# Patient Record
Sex: Female | Born: 1955 | Race: White | Hispanic: No | Smoking: Never smoker
Health system: Southern US, Community
[De-identification: ages and names within clinical notes are randomized; demographics above are authoritative.]

## PROBLEM LIST (undated history)

## (undated) DIAGNOSIS — J189 Pneumonia, unspecified organism: Secondary | ICD-10-CM

## (undated) DIAGNOSIS — T7840XA Allergy, unspecified, initial encounter: Secondary | ICD-10-CM

## (undated) DIAGNOSIS — T8859XA Other complications of anesthesia, initial encounter: Secondary | ICD-10-CM

## (undated) DIAGNOSIS — E785 Hyperlipidemia, unspecified: Secondary | ICD-10-CM

## (undated) DIAGNOSIS — Z87442 Personal history of urinary calculi: Secondary | ICD-10-CM

## (undated) DIAGNOSIS — M1990 Unspecified osteoarthritis, unspecified site: Secondary | ICD-10-CM

## (undated) DIAGNOSIS — R011 Cardiac murmur, unspecified: Secondary | ICD-10-CM

## (undated) DIAGNOSIS — I10 Essential (primary) hypertension: Secondary | ICD-10-CM

## (undated) DIAGNOSIS — K219 Gastro-esophageal reflux disease without esophagitis: Secondary | ICD-10-CM

## (undated) DIAGNOSIS — T4145XA Adverse effect of unspecified anesthetic, initial encounter: Secondary | ICD-10-CM

## (undated) HISTORY — DX: Hyperlipidemia, unspecified: E78.5

## (undated) HISTORY — PX: TOTAL KNEE ARTHROPLASTY: SHX125

## (undated) HISTORY — DX: Essential (primary) hypertension: I10

## (undated) HISTORY — DX: Allergy, unspecified, initial encounter: T78.40XA

---
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
## 1993-08-05 DIAGNOSIS — T7840XA Allergy, unspecified, initial encounter: Secondary | ICD-10-CM

## 1993-08-05 HISTORY — DX: Allergy, unspecified, initial encounter: T78.40XA

## 1997-11-02 ENCOUNTER — Ambulatory Visit (HOSPITAL_COMMUNITY): Admission: RE | Admit: 1997-11-02 | Discharge: 1997-11-02 | Payer: Self-pay | Admitting: Obstetrics and Gynecology

## 1998-02-22 ENCOUNTER — Other Ambulatory Visit: Admission: RE | Admit: 1998-02-22 | Discharge: 1998-02-22 | Payer: Self-pay | Admitting: Obstetrics and Gynecology

## 1998-12-05 ENCOUNTER — Encounter: Payer: Self-pay | Admitting: Obstetrics and Gynecology

## 1998-12-05 ENCOUNTER — Ambulatory Visit (HOSPITAL_COMMUNITY): Admission: RE | Admit: 1998-12-05 | Discharge: 1998-12-05 | Payer: Self-pay | Admitting: Obstetrics and Gynecology

## 1999-12-11 ENCOUNTER — Encounter: Payer: Self-pay | Admitting: Obstetrics and Gynecology

## 1999-12-11 ENCOUNTER — Ambulatory Visit (HOSPITAL_COMMUNITY): Admission: RE | Admit: 1999-12-11 | Discharge: 1999-12-11 | Payer: Self-pay | Admitting: Obstetrics and Gynecology

## 2000-04-28 ENCOUNTER — Other Ambulatory Visit: Admission: RE | Admit: 2000-04-28 | Discharge: 2000-04-28 | Payer: Self-pay | Admitting: *Deleted

## 2004-07-19 ENCOUNTER — Encounter (INDEPENDENT_AMBULATORY_CARE_PROVIDER_SITE_OTHER): Payer: Self-pay | Admitting: *Deleted

## 2004-07-19 ENCOUNTER — Ambulatory Visit: Admission: RE | Admit: 2004-07-19 | Discharge: 2004-07-19 | Payer: Self-pay | Admitting: Family Medicine

## 2005-12-09 ENCOUNTER — Ambulatory Visit (HOSPITAL_COMMUNITY): Admission: RE | Admit: 2005-12-09 | Discharge: 2005-12-09 | Payer: Self-pay | Admitting: Family Medicine

## 2007-01-22 ENCOUNTER — Ambulatory Visit (HOSPITAL_COMMUNITY): Admission: RE | Admit: 2007-01-22 | Discharge: 2007-01-22 | Payer: Self-pay | Admitting: *Deleted

## 2008-02-25 ENCOUNTER — Ambulatory Visit (HOSPITAL_COMMUNITY): Admission: RE | Admit: 2008-02-25 | Discharge: 2008-02-25 | Payer: Self-pay | Admitting: *Deleted

## 2008-08-05 HISTORY — PX: LITHOTRIPSY: SUR834

## 2008-08-05 HISTORY — PX: URETEROSCOPY: SHX842

## 2009-03-13 ENCOUNTER — Ambulatory Visit (HOSPITAL_COMMUNITY): Admission: RE | Admit: 2009-03-13 | Discharge: 2009-03-13 | Payer: Self-pay | Admitting: Obstetrics & Gynecology

## 2009-05-02 ENCOUNTER — Emergency Department (HOSPITAL_COMMUNITY): Admission: EM | Admit: 2009-05-02 | Discharge: 2009-05-02 | Payer: Self-pay | Admitting: Emergency Medicine

## 2009-05-11 ENCOUNTER — Ambulatory Visit (HOSPITAL_COMMUNITY): Admission: RE | Admit: 2009-05-11 | Discharge: 2009-05-11 | Payer: Self-pay | Admitting: Urology

## 2009-06-15 ENCOUNTER — Ambulatory Visit (HOSPITAL_BASED_OUTPATIENT_CLINIC_OR_DEPARTMENT_OTHER): Admission: RE | Admit: 2009-06-15 | Discharge: 2009-06-15 | Payer: Self-pay | Admitting: Urology

## 2010-05-30 ENCOUNTER — Emergency Department (HOSPITAL_COMMUNITY): Admission: EM | Admit: 2010-05-30 | Discharge: 2010-05-31 | Payer: Self-pay | Admitting: Emergency Medicine

## 2010-07-12 ENCOUNTER — Ambulatory Visit (HOSPITAL_COMMUNITY)
Admission: RE | Admit: 2010-07-12 | Discharge: 2010-07-12 | Payer: Self-pay | Source: Home / Self Care | Attending: Obstetrics & Gynecology | Admitting: Obstetrics & Gynecology

## 2010-08-05 HISTORY — PX: EYE SURGERY: SHX253

## 2010-11-07 LAB — POCT PREGNANCY, URINE: Preg Test, Ur: NEGATIVE

## 2010-11-07 LAB — POCT HEMOGLOBIN-HEMACUE: Hemoglobin: 14.6 g/dL (ref 12.0–15.0)

## 2010-11-08 LAB — PREGNANCY, URINE: Preg Test, Ur: NEGATIVE

## 2010-11-09 LAB — BASIC METABOLIC PANEL
BUN: 18 mg/dL (ref 6–23)
Calcium: 8.9 mg/dL (ref 8.4–10.5)
Creatinine, Ser: 0.77 mg/dL (ref 0.4–1.2)
GFR calc Af Amer: >60 mL/min (ref >60)
GFR calc non Af Amer: >60 mL/min (ref >60)
Glucose, Bld: 110 mg/dL — ABNORMAL HIGH (ref 70–99)
Potassium: 4.1 mEq/L (ref 3.5–5.1)
Sodium: 139 mEq/L (ref 135–145)

## 2010-11-09 LAB — URINALYSIS, ROUTINE W REFLEX MICROSCOPIC
Ketones, ur: NEGATIVE mg/dL
Specific Gravity, Urine: 1.018 (ref 1.005–1.030)
Urobilinogen, UA: 0.2 mg/dL (ref 0.0–1.0)

## 2010-11-09 LAB — URINE MICROSCOPIC-ADD ON

## 2010-11-09 LAB — DIFFERENTIAL
Basophils Absolute: 0 10*3/uL (ref 0.0–0.1)
Monocytes Absolute: 0.4 10*3/uL (ref 0.1–1.0)
Neutrophils Relative %: 89 % — ABNORMAL HIGH (ref 43–77)

## 2010-11-09 LAB — CBC
Hemoglobin: 15 g/dL (ref 12.0–15.0)
MCV: 90.2 fL (ref 78.0–100.0)
Platelets: 309 10*3/uL (ref 150–400)
RBC: 4.78 MIL/uL (ref 3.87–5.11)
WBC: 12.7 10*3/uL — ABNORMAL HIGH (ref 4.0–10.5)

## 2011-08-14 ENCOUNTER — Other Ambulatory Visit (HOSPITAL_COMMUNITY): Payer: Self-pay | Admitting: Obstetrics & Gynecology

## 2011-08-14 DIAGNOSIS — Z1231 Encounter for screening mammogram for malignant neoplasm of breast: Secondary | ICD-10-CM

## 2011-08-21 ENCOUNTER — Ambulatory Visit (HOSPITAL_COMMUNITY)
Admission: RE | Admit: 2011-08-21 | Discharge: 2011-08-21 | Disposition: A | Discharge status: Home / Self Care | Payer: BC Managed Care – PPO | Source: Ambulatory Visit | Attending: Obstetrics & Gynecology | Admitting: Obstetrics & Gynecology

## 2011-08-21 DIAGNOSIS — Z1231 Encounter for screening mammogram for malignant neoplasm of breast: Secondary | ICD-10-CM

## 2012-08-05 HISTORY — PX: DILATION AND CURETTAGE OF UTERUS: SHX78

## 2012-08-10 ENCOUNTER — Encounter (HOSPITAL_COMMUNITY): Payer: Self-pay | Admitting: Pharmacist

## 2012-08-10 ENCOUNTER — Other Ambulatory Visit: Payer: Self-pay | Admitting: Obstetrics & Gynecology

## 2012-08-11 ENCOUNTER — Encounter (HOSPITAL_COMMUNITY): Payer: Self-pay | Admitting: *Deleted

## 2012-08-19 ENCOUNTER — Ambulatory Visit (HOSPITAL_COMMUNITY): Payer: BC Managed Care – PPO | Admitting: Anesthesiology

## 2012-08-19 ENCOUNTER — Ambulatory Visit (HOSPITAL_COMMUNITY)
Admission: RE | Admit: 2012-08-19 | Discharge: 2012-08-19 | Disposition: A | Discharge status: Home / Self Care | Payer: BC Managed Care – PPO | Source: Ambulatory Visit | Attending: Obstetrics & Gynecology | Admitting: Obstetrics & Gynecology

## 2012-08-19 ENCOUNTER — Encounter (HOSPITAL_COMMUNITY): Payer: Self-pay | Admitting: *Deleted

## 2012-08-19 ENCOUNTER — Encounter (HOSPITAL_COMMUNITY): Payer: Self-pay | Admitting: Anesthesiology

## 2012-08-19 ENCOUNTER — Encounter (HOSPITAL_COMMUNITY)
Admission: RE | Disposition: A | Discharge status: Home / Self Care | Payer: Self-pay | Source: Ambulatory Visit | Attending: Obstetrics & Gynecology

## 2012-08-19 DIAGNOSIS — N840 Polyp of corpus uteri: Secondary | ICD-10-CM | POA: Insufficient documentation

## 2012-08-19 DIAGNOSIS — N950 Postmenopausal bleeding: Secondary | ICD-10-CM | POA: Insufficient documentation

## 2012-08-19 DIAGNOSIS — Q513 Bicornate uterus: Secondary | ICD-10-CM | POA: Insufficient documentation

## 2012-08-19 HISTORY — DX: Other complications of anesthesia, initial encounter: T88.59XA

## 2012-08-19 HISTORY — DX: Unspecified osteoarthritis, unspecified site: M19.90

## 2012-08-19 HISTORY — DX: Adverse effect of unspecified anesthetic, initial encounter: T41.45XA

## 2012-08-19 LAB — CBC
MCH: 30.5 pg (ref 26.0–34.0)
MCV: 87.7 fL (ref 78.0–100.0)
Platelets: 277 10*3/uL (ref 150–400)
RDW: 12.4 % (ref 11.5–15.5)
WBC: 6 10*3/uL (ref 4.0–10.5)

## 2012-08-19 SURGERY — DILATATION & CURETTAGE/HYSTEROSCOPY WITH TRUCLEAR
Anesthesia: General | Site: Vagina | Wound class: Clean Contaminated

## 2012-08-19 MED ORDER — DEXAMETHASONE SODIUM PHOSPHATE 4 MG/ML IJ SOLN
INTRAMUSCULAR | Status: DC | PRN
  Administered 2012-08-19: 10 mg via INTRAVENOUS

## 2012-08-19 MED ORDER — ONDANSETRON HCL 4 MG/2ML IJ SOLN
INTRAMUSCULAR | Status: AC
  Filled 2012-08-19: qty 2

## 2012-08-19 MED ORDER — FENTANYL CITRATE 0.05 MG/ML IJ SOLN
INTRAMUSCULAR | Status: DC | PRN
  Administered 2012-08-19 (×2): 50 ug via INTRAVENOUS

## 2012-08-19 MED ORDER — CHLOROPROCAINE HCL 1 % IJ SOLN
INTRAMUSCULAR | Status: AC
  Filled 2012-08-19: qty 30

## 2012-08-19 MED ORDER — DEXAMETHASONE SODIUM PHOSPHATE 10 MG/ML IJ SOLN
INTRAMUSCULAR | Status: AC
  Filled 2012-08-19: qty 1

## 2012-08-19 MED ORDER — PROPOFOL 10 MG/ML IV EMUL
INTRAVENOUS | Status: AC
  Filled 2012-08-19: qty 20

## 2012-08-19 MED ORDER — GLYCOPYRROLATE 0.2 MG/ML IJ SOLN
INTRAMUSCULAR | Status: AC
  Filled 2012-08-19: qty 1

## 2012-08-19 MED ORDER — DEXAMETHASONE SODIUM PHOSPHATE 4 MG/ML IJ SOLN: INTRAMUSCULAR | Status: DC | PRN

## 2012-08-19 MED ORDER — KETOROLAC TROMETHAMINE 30 MG/ML IJ SOLN
INTRAMUSCULAR | Status: AC
  Filled 2012-08-19: qty 1

## 2012-08-19 MED ORDER — CEFAZOLIN SODIUM-DEXTROSE 2-3 GM-% IV SOLR
INTRAVENOUS | Status: AC
  Filled 2012-08-19: qty 50

## 2012-08-19 MED ORDER — CHLOROPROCAINE HCL 1 % IJ SOLN
INTRAMUSCULAR | Status: DC | PRN
  Administered 2012-08-19: 20 mL

## 2012-08-19 MED ORDER — LIDOCAINE HCL (CARDIAC) 20 MG/ML IV SOLN
INTRAVENOUS | Status: DC | PRN
  Administered 2012-08-19: 100 mg via INTRAVENOUS

## 2012-08-19 MED ORDER — PROPOFOL 10 MG/ML IV EMUL
INTRAVENOUS | Status: DC | PRN
  Administered 2012-08-19: 180 mg via INTRAVENOUS
  Administered 2012-08-19: 50 mg via INTRAVENOUS

## 2012-08-19 MED ORDER — ONDANSETRON HCL 4 MG/2ML IJ SOLN
INTRAMUSCULAR | Status: DC | PRN
  Administered 2012-08-19: 4 mg via INTRAVENOUS

## 2012-08-19 MED ORDER — LIDOCAINE HCL (CARDIAC) 20 MG/ML IV SOLN
INTRAVENOUS | Status: AC
  Filled 2012-08-19: qty 5

## 2012-08-19 MED ORDER — KETOROLAC TROMETHAMINE 30 MG/ML IJ SOLN
INTRAMUSCULAR | Status: DC | PRN
  Administered 2012-08-19: 30 mg via INTRAVENOUS

## 2012-08-19 MED ORDER — HYDROCODONE-ACETAMINOPHEN 5-500 MG PO TABS: 1.0000 | ORAL_TABLET | Freq: Four times a day (QID) | ORAL | Status: DC | PRN

## 2012-08-19 MED ORDER — GLYCOPYRROLATE 0.2 MG/ML IJ SOLN
INTRAMUSCULAR | Status: DC | PRN
  Administered 2012-08-19: 0.1 mg via INTRAVENOUS

## 2012-08-19 MED ORDER — SODIUM CHLORIDE 0.9 % IR SOLN
Status: DC | PRN
  Administered 2012-08-19: 3000 mL

## 2012-08-19 MED ORDER — FENTANYL CITRATE 0.05 MG/ML IJ SOLN
INTRAMUSCULAR | Status: AC
  Filled 2012-08-19: qty 2

## 2012-08-19 MED ORDER — LACTATED RINGERS IV SOLN
INTRAVENOUS | Status: DC
  Administered 2012-08-19: 125 mL/h via INTRAVENOUS
  Administered 2012-08-19: 09:00:00 via INTRAVENOUS

## 2012-08-19 MED ORDER — CEFAZOLIN SODIUM-DEXTROSE 2-3 GM-% IV SOLR
2.0000 g | INTRAVENOUS | Status: AC
  Administered 2012-08-19: 2 g via INTRAVENOUS

## 2012-08-19 SURGICAL SUPPLY — 20 items
BLADE INCISOR TRUC PLUS 2.9 (ABLATOR) ×1 IMPLANT
CANISTERS HI-FLOW 3000CC (CANNISTER) ×4 IMPLANT
CATH ROBINSON RED A/P 16FR (CATHETERS) ×2 IMPLANT
CLOTH BEACON ORANGE TIMEOUT ST (SAFETY) ×2 IMPLANT
CONTAINER PREFILL 10% NBF 60ML (FORM) ×4 IMPLANT
DRAPE HYSTEROSCOPY (DRAPE) ×2 IMPLANT
DRESSING TELFA 8X3 (GAUZE/BANDAGES/DRESSINGS) ×2 IMPLANT
GLOVE BIO SURGEON STRL SZ 6.5 (GLOVE) ×4 IMPLANT
GLOVE BIOGEL PI IND STRL 7.0 (GLOVE) ×1 IMPLANT
GLOVE BIOGEL PI INDICATOR 7.0 (GLOVE) ×1
GOWN STRL REIN XL XLG (GOWN DISPOSABLE) ×4 IMPLANT
INCISOR TRUC PLUS BLADE 2.9 (ABLATOR) ×2
KIT HYSTEROSCOPY TRUCLEAR (ABLATOR) ×2 IMPLANT
MORCELLATOR RECIP TRUCLEAR 4.0 (ABLATOR) IMPLANT
NEEDLE SPNL 22GX3.5 QUINCKE BK (NEEDLE) ×2 IMPLANT
PACK VAGINAL MINOR WOMEN LF (CUSTOM PROCEDURE TRAY) ×2 IMPLANT
PAD OB MATERNITY 4.3X12.25 (PERSONAL CARE ITEMS) ×2 IMPLANT
SYR CONTROL 10ML LL (SYRINGE) ×2 IMPLANT
TOWEL OR 17X24 6PK STRL BLUE (TOWEL DISPOSABLE) ×4 IMPLANT
WATER STERILE IRR 1000ML POUR (IV SOLUTION) ×2 IMPLANT

## 2012-08-19 NOTE — Anesthesia Preprocedure Evaluation (Signed)
Anesthesia Evaluation  Patient identified by MRN, date of birth, ID band Patient awake    Reviewed: Allergy & Precautions, H&P , Patient's Chart, lab work & pertinent test results, reviewed documented beta blocker date and time   History of Anesthesia Complications Negative for: history of anesthetic complications  Airway Mallampati: II TM Distance: >3 FB Neck ROM: full    Dental No notable dental hx.    Pulmonary neg pulmonary ROS,  breath sounds clear to auscultation  Pulmonary exam normal       Cardiovascular Exercise Tolerance: Good negative cardio ROS  Rhythm:regular Rate:Normal     Neuro/Psych negative neurological ROS  negative psych ROS   GI/Hepatic negative GI ROS, Neg liver ROS,   Endo/Other  negative endocrine ROS  Renal/GU negative Renal ROS     Musculoskeletal   Abdominal   Peds  Hematology negative hematology ROS (+)   Anesthesia Other Findings   Reproductive/Obstetrics negative OB ROS                           Anesthesia Physical Anesthesia Plan  ASA: I  Anesthesia Plan: General LMA   Post-op Pain Management:    Induction:   Airway Management Planned:   Additional Equipment:   Intra-op Plan:   Post-operative Plan:   Informed Consent: I have reviewed the patients History and Physical, chart, labs and discussed the procedure including the risks, benefits and alternatives for the proposed anesthesia with the patient or authorized representative who has indicated his/her understanding and acceptance.   Dental Advisory Given  Plan Discussed with: CRNA, Surgeon and Anesthesiologist  Anesthesia Plan Comments:         Anesthesia Quick Evaluation  

## 2012-08-19 NOTE — H&P (Addendum)
Heather Lambert is an 57 y.o. female G0 widow  RP:  IU lesion, probable polyp  Pertinent Gynecological History: Menses: post-menopausal Bleeding: post menopausal bleeding Contraception: none, abstinence Blood transfusions: none Sexually transmitted diseases: no past history Last mammogram: normal  Last pap: normal   Menstrual History:  No LMP recorded. Patient is postmenopausal.    Past Medical History  Diagnosis Date  . Arthritis     knees    Past Surgical History  Procedure Date  . Lithotripsy   . Ureteroscopy   . Tonsillectomy   . Tonsillectomy and adenoidectomy     History reviewed. No pertinent family history.  Social History:  reports that she has never smoked. She has never used smokeless tobacco. She reports that she drinks about .6 ounces of alcohol per week. She reports that she does not use illicit drugs.  Allergies:  Allergies  Allergen Reactions  . Midazolam Other (See Comments)    Reaction during lithotripsy    Prescriptions prior to admission  Medication Sig Dispense Refill  . Multiple Vitamins-Minerals (MULTIVITAMIN WITH MINERALS) tablet Take 1 tablet by mouth daily.      . naproxen sodium (ANAPROX) 220 MG tablet Take 220 mg by mouth 2 (two) times daily as needed. For knee pain      . Omega-3 Fatty Acids (FISH OIL) 1200 MG CAPS Take 2 capsules by mouth daily.        Blood pressure 152/91, pulse 81, temperature 97.5 F (36.4 C), temperature source Oral, resp. rate 18, height 5\' 2"  (1.575 m), weight 77.111 kg (170 lb), SpO2 98.00%.  IU lesion 1.1 cm per sonohysto  No results found for this or any previous visit (from the past 24 hour(s)).  No results found.  Assessment/Plan: IU polyp for HSC, resection, D+C.  Surgery and risks reviewed.  Heather Lambert,MARIE-LYNE 08/19/2012, 7:08 AM

## 2012-08-19 NOTE — Op Note (Signed)
08/19/2012  9:21 AM  PATIENT:  Heather Lambert  57 y.o. female  PRE-OPERATIVE DIAGNOSIS:  Postmenopausal bleeding, Intrauterine lesion, Possible polyp  POST-OPERATIVE DIAGNOSIS:  postmenopausal bleeding, Intrauterine lesion, Polyp  PROCEDURE:  Procedure(s): DILATATION & CURETTAGE/HYSTEROSCOPY WITH TRUCLEAR RESECTION  SURGEON:  Surgeon(s): Genia Del, MD  ASSISTANTS: none   ANESTHESIA:  MAC and Paracervical block  PROCEDURE:  Under MAC analgesia the patient is in lithotomy position. She is prepped with Betadine on the suprapubic vulvar and vaginal areas and the bladder is catheterized. The vaginal exam reveals an anteverted uterus normal volume mobile no adnexal mass. The speculum was introduced in the vagina, the anterior lip of the cervix is grasped with a tenaculum. A paracervical block was done with Nesacaine 1% a total of 20 cc at 4 and 8:00.  The hysterometry is 7 cm. Dilation of the cervix is done with Hegar dilators up to #23 without difficulty. The hysteroscope was introduced in the intrauterine cavity. 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 The left ostium was well visualized. A small polyp was right in front of the right ostium.  A small polyp was present on the right lateral wall of the uterus as well as a low polyp on the anterior wall of the uterus.  We used the Truclear device to resect those 3 small polyps.  We then proceeded with a systematic endometrial curettage on all intrauterine surfaces with a small sharp curette. Both specimens were sent together to pathology.  The tenaculum was removed. Hemostasis was adequate. The speculum was removed. The patient was brought to recovery room in good and stable status.  ESTIMATED BLOOD LOSS: 10 cc  FLUID DEFICIT:  210 cc   Intake/Output Summary (Last 24 hours) at 08/19/12 3664 Last data filed at 08/19/12 0900  Gross per 24 hour  Intake   1000 ml  Output     10 ml  Net    990 ml     BLOOD ADMINISTERED:none   LOCAL  MEDICATIONS USED:  Nesacaine 1% 20 cc  SPECIMEN:  Source of Specimen:  Resection and endometrial curettings  DISPOSITION OF SPECIMEN:  PATHOLOGY  COUNTS:  YES  PLAN OF CARE: Transfer to PACU   Genia Del  MD   08/19/2012  At 9:24 am

## 2012-08-19 NOTE — Anesthesia Postprocedure Evaluation (Signed)
Anesthesia Post Note  Patient: Heather Lambert  Procedure(s) Performed: Procedure(s) (LRB): DILATATION & CURETTAGE/HYSTEROSCOPY WITH TRUCLEAR (N/A)  Anesthesia type: GA  Patient location: PACU  Post pain: Pain level controlled  Post assessment: Post-op Vital signs reviewed  Last Vitals:  Filed Vitals:   08/19/12 1030  BP: 129/81  Pulse: 57  Temp: 36.3 C  Resp: 24    Post vital signs: Reviewed  Level of consciousness: sedated  Complications: No apparent anesthesia complications

## 2012-08-19 NOTE — Addendum Note (Signed)
Addendum  created 08/19/12 1449 by Danyael Alipio M Antwan Bribiesca, RN   Modules edited:Charges VN    

## 2012-08-19 NOTE — Addendum Note (Signed)
Addendum  created 08/19/12 1449 by Armanda Heritage, RN   Modules edited:Charges VN

## 2012-08-19 NOTE — Discharge Summary (Signed)
  Physician Discharge Summary  Patient ID: Shakevia Sarris MRN: [id number] DOB/AGE: 56/29/1957 56 y.o.  Admit date: 08/19/2012 Discharge date: 08/19/2012  Admission Diagnoses: Postmenopausal bleeding, Intrauterine lesion, Possible polyp  Discharge Diagnoses: Postmenopausal bleeding, Intrauterine lesion, Possible polyp        Active Problems:  * No active hospital problems. *    Discharged Condition: good  Hospital Course: Outpatient  Consults: None  Treatments: surgery: Hysteroscopy with resection and D+C  Disposition: Final discharge disposition not confirmed     Medication List     As of 08/19/2012  9:32 AM    TAKE these medications         Fish Oil 1200 MG Caps   Take 2 capsules by mouth daily.      HYDROcodone-acetaminophen 5-500 MG per tablet   Commonly known as: VICODIN   Take 1 tablet by mouth every 6 (six) hours as needed for pain.      multivitamin with minerals tablet   Take 1 tablet by mouth daily.      naproxen sodium 220 MG tablet   Commonly known as: ANAPROX   Take 220 mg by mouth 2 (two) times daily as needed. For knee pain           Follow-up Information    Follow up with Oshua Mcconaha,MARIE-LYNE, MD. In 3 weeks.   Contact information:   [address] [place] [address] [phone]          Signed: Genia Del, MD 08/19/2012, 9:32 AM

## 2012-08-19 NOTE — Transfer of Care (Signed)
Immediate Anesthesia Transfer of Care Note  Patient: Heather Lambert  Procedure(s) Performed: Procedure(s) (LRB) with comments: DILATATION & CURETTAGE/HYSTEROSCOPY WITH TRUCLEAR (N/A)  Patient Location: PACU  Anesthesia Type:General  Level of Consciousness: awake, alert  and oriented  Airway & Oxygen Therapy: Patient Spontanous Breathing and Patient connected to nasal cannula oxygen  Post-op Assessment: Report given to PACU RN and Post -op Vital signs reviewed and stable  Post vital signs: Reviewed and stable  Complications: No apparent anesthesia complications

## 2012-08-19 NOTE — Anesthesia Procedure Notes (Signed)
Procedure Name: LMA Insertion Date/Time: 08/19/2012 8:38 AM Performed by: Kostas Marrow, Jannet Askew Pre-anesthesia Checklist: Patient identified, Patient being monitored, Emergency Drugs available, Timeout performed and Suction available Patient Re-evaluated:Patient Re-evaluated prior to inductionOxygen Delivery Method: Circle system utilized Preoxygenation: Pre-oxygenation with 100% oxygen Intubation Type: IV induction LMA: LMA inserted LMA Size: 4.0

## 2012-08-28 ENCOUNTER — Other Ambulatory Visit (HOSPITAL_COMMUNITY): Payer: Self-pay | Admitting: Obstetrics & Gynecology

## 2012-08-28 DIAGNOSIS — Z1231 Encounter for screening mammogram for malignant neoplasm of breast: Secondary | ICD-10-CM

## 2012-09-03 ENCOUNTER — Ambulatory Visit (HOSPITAL_COMMUNITY)
Admission: RE | Admit: 2012-09-03 | Discharge: 2012-09-03 | Disposition: A | Discharge status: Home / Self Care | Payer: BC Managed Care – PPO | Source: Ambulatory Visit | Attending: Obstetrics & Gynecology | Admitting: Obstetrics & Gynecology

## 2012-09-03 DIAGNOSIS — Z1231 Encounter for screening mammogram for malignant neoplasm of breast: Secondary | ICD-10-CM | POA: Insufficient documentation

## 2013-11-16 ENCOUNTER — Other Ambulatory Visit (HOSPITAL_COMMUNITY): Payer: Self-pay | Admitting: Obstetrics & Gynecology

## 2013-11-16 DIAGNOSIS — Z1231 Encounter for screening mammogram for malignant neoplasm of breast: Secondary | ICD-10-CM

## 2013-11-18 ENCOUNTER — Ambulatory Visit (HOSPITAL_COMMUNITY)
Admission: RE | Admit: 2013-11-18 | Discharge: 2013-11-18 | Disposition: A | Discharge status: Home / Self Care | Payer: BC Managed Care – PPO | Source: Ambulatory Visit | Attending: Obstetrics & Gynecology | Admitting: Obstetrics & Gynecology

## 2013-11-18 DIAGNOSIS — Z1231 Encounter for screening mammogram for malignant neoplasm of breast: Secondary | ICD-10-CM | POA: Insufficient documentation

## 2014-07-05 ENCOUNTER — Encounter: Payer: Self-pay | Admitting: Family Medicine

## 2014-07-05 DIAGNOSIS — I10 Essential (primary) hypertension: Secondary | ICD-10-CM | POA: Insufficient documentation

## 2014-07-05 DIAGNOSIS — T7840XA Allergy, unspecified, initial encounter: Secondary | ICD-10-CM | POA: Insufficient documentation

## 2014-07-05 DIAGNOSIS — E785 Hyperlipidemia, unspecified: Secondary | ICD-10-CM | POA: Insufficient documentation

## 2014-07-18 ENCOUNTER — Ambulatory Visit: Payer: BC Managed Care – PPO | Admitting: Physician Assistant

## 2015-01-23 ENCOUNTER — Other Ambulatory Visit (HOSPITAL_COMMUNITY): Payer: Self-pay | Admitting: Family Medicine

## 2015-01-23 DIAGNOSIS — Z1231 Encounter for screening mammogram for malignant neoplasm of breast: Secondary | ICD-10-CM

## 2015-02-01 ENCOUNTER — Ambulatory Visit (HOSPITAL_COMMUNITY)
Admission: RE | Admit: 2015-02-01 | Discharge: 2015-02-01 | Disposition: A | Discharge status: Home / Self Care | Payer: BLUE CROSS/BLUE SHIELD | Source: Ambulatory Visit | Attending: Family Medicine | Admitting: Family Medicine

## 2015-02-01 DIAGNOSIS — Z1231 Encounter for screening mammogram for malignant neoplasm of breast: Secondary | ICD-10-CM

## 2015-11-20 DIAGNOSIS — H5213 Myopia, bilateral: Secondary | ICD-10-CM | POA: Diagnosis not present

## 2015-11-29 DIAGNOSIS — E559 Vitamin D deficiency, unspecified: Secondary | ICD-10-CM | POA: Diagnosis not present

## 2015-11-29 DIAGNOSIS — Z136 Encounter for screening for cardiovascular disorders: Secondary | ICD-10-CM | POA: Diagnosis not present

## 2015-11-30 DIAGNOSIS — E785 Hyperlipidemia, unspecified: Secondary | ICD-10-CM | POA: Diagnosis not present

## 2015-11-30 DIAGNOSIS — E559 Vitamin D deficiency, unspecified: Secondary | ICD-10-CM | POA: Diagnosis not present

## 2015-11-30 DIAGNOSIS — I10 Essential (primary) hypertension: Secondary | ICD-10-CM | POA: Diagnosis not present

## 2015-11-30 DIAGNOSIS — Z6832 Body mass index (BMI) 32.0-32.9, adult: Secondary | ICD-10-CM | POA: Diagnosis not present

## 2016-02-21 ENCOUNTER — Other Ambulatory Visit: Payer: Self-pay | Admitting: Family Medicine

## 2016-02-21 DIAGNOSIS — Z1231 Encounter for screening mammogram for malignant neoplasm of breast: Secondary | ICD-10-CM

## 2016-02-29 ENCOUNTER — Ambulatory Visit
Admission: RE | Admit: 2016-02-29 | Discharge: 2016-02-29 | Disposition: A | Discharge status: Home / Self Care | Payer: BLUE CROSS/BLUE SHIELD | Source: Ambulatory Visit | Attending: Family Medicine | Admitting: Family Medicine

## 2016-02-29 DIAGNOSIS — Z1231 Encounter for screening mammogram for malignant neoplasm of breast: Secondary | ICD-10-CM

## 2016-04-10 DIAGNOSIS — Z0000 Encounter for general adult medical examination without abnormal findings: Secondary | ICD-10-CM | POA: Diagnosis not present

## 2016-04-10 DIAGNOSIS — Z136 Encounter for screening for cardiovascular disorders: Secondary | ICD-10-CM | POA: Diagnosis not present

## 2016-04-12 DIAGNOSIS — Z0100 Encounter for examination of eyes and vision without abnormal findings: Secondary | ICD-10-CM | POA: Diagnosis not present

## 2016-04-12 DIAGNOSIS — Z1211 Encounter for screening for malignant neoplasm of colon: Secondary | ICD-10-CM | POA: Diagnosis not present

## 2016-04-12 DIAGNOSIS — Z00129 Encounter for routine child health examination without abnormal findings: Secondary | ICD-10-CM | POA: Diagnosis not present

## 2016-04-12 DIAGNOSIS — Z0000 Encounter for general adult medical examination without abnormal findings: Secondary | ICD-10-CM | POA: Diagnosis not present

## 2016-04-12 DIAGNOSIS — Z23 Encounter for immunization: Secondary | ICD-10-CM | POA: Diagnosis not present

## 2016-08-07 DIAGNOSIS — H01119 Allergic dermatitis of unspecified eye, unspecified eyelid: Secondary | ICD-10-CM | POA: Diagnosis not present

## 2016-08-15 DIAGNOSIS — Z6832 Body mass index (BMI) 32.0-32.9, adult: Secondary | ICD-10-CM | POA: Diagnosis not present

## 2016-08-15 DIAGNOSIS — I10 Essential (primary) hypertension: Secondary | ICD-10-CM | POA: Diagnosis not present

## 2016-08-15 DIAGNOSIS — E785 Hyperlipidemia, unspecified: Secondary | ICD-10-CM | POA: Diagnosis not present

## 2016-11-20 DIAGNOSIS — H2513 Age-related nuclear cataract, bilateral: Secondary | ICD-10-CM | POA: Diagnosis not present

## 2016-11-22 DIAGNOSIS — M1711 Unilateral primary osteoarthritis, right knee: Secondary | ICD-10-CM | POA: Diagnosis not present

## 2016-11-22 DIAGNOSIS — M170 Bilateral primary osteoarthritis of knee: Secondary | ICD-10-CM | POA: Diagnosis not present

## 2016-11-22 DIAGNOSIS — M1712 Unilateral primary osteoarthritis, left knee: Secondary | ICD-10-CM | POA: Diagnosis not present

## 2016-12-16 DIAGNOSIS — I10 Essential (primary) hypertension: Secondary | ICD-10-CM | POA: Diagnosis not present

## 2016-12-16 DIAGNOSIS — M179 Osteoarthritis of knee, unspecified: Secondary | ICD-10-CM | POA: Diagnosis not present

## 2016-12-16 DIAGNOSIS — E785 Hyperlipidemia, unspecified: Secondary | ICD-10-CM | POA: Diagnosis not present

## 2016-12-16 DIAGNOSIS — Z6833 Body mass index (BMI) 33.0-33.9, adult: Secondary | ICD-10-CM | POA: Diagnosis not present

## 2016-12-23 DIAGNOSIS — M25512 Pain in left shoulder: Secondary | ICD-10-CM | POA: Diagnosis not present

## 2017-01-01 DIAGNOSIS — M25512 Pain in left shoulder: Secondary | ICD-10-CM | POA: Diagnosis not present

## 2017-01-07 ENCOUNTER — Encounter (HOSPITAL_COMMUNITY): Payer: Self-pay | Admitting: *Deleted

## 2017-01-23 ENCOUNTER — Other Ambulatory Visit: Payer: Self-pay | Admitting: Family Medicine

## 2017-01-23 DIAGNOSIS — Z1231 Encounter for screening mammogram for malignant neoplasm of breast: Secondary | ICD-10-CM

## 2017-01-28 ENCOUNTER — Ambulatory Visit (INDEPENDENT_AMBULATORY_CARE_PROVIDER_SITE_OTHER): Payer: BLUE CROSS/BLUE SHIELD | Admitting: Allergy and Immunology

## 2017-01-28 ENCOUNTER — Encounter: Payer: Self-pay | Admitting: Allergy and Immunology

## 2017-01-28 VITALS — BP 130/74 | HR 84 | Temp 98.1°F | Resp 16 | Ht 60.0 in | Wt 174.2 lb

## 2017-01-28 DIAGNOSIS — Z9109 Other allergy status, other than to drugs and biological substances: Secondary | ICD-10-CM

## 2017-01-28 NOTE — Progress Notes (Signed)
Dear Dr. Lequita HaltAluisio,  Thank you for referring Heather Lambert [facility] on 01/28/2017.   Below is a summation of this patient's evaluation and recommendations.  Thank you for your referral. I will keep you informed about this patient's response to treatment.   If you have any questions please do not hesitate to contact me.   Sincerely,  Jessica PriestEric J. Victorio Creeden, MD Allergy / Immunology [facility]   ______________________________________________________________________    NEW PATIENT NOTE  Referring Provider: Ollen GrossAluisio, Frank, MD Primary Provider: Lewis Moccasinewey, [name] R, MD Date of office visit: 01/28/2017    Subjective:   Chief Complaint:  Heather Lambert (DOB: 1956-02-27) is a 61 y.o. female who presents to the clinic on 01/28/2017 with a chief complaint of Allergy Testing (having knee sx replacement; metal testing) .     HPI: Heather Lambert presents to this clinic in evaluation of possible metal allergy. She is scheduled for a left total knee replacement in July and she has had a problem with metal exposure in the past. Whenever she uses any type of "silver-like" jewelry she will develop a contact rash. When she ware metal-based glasses she developed a rash at areas of contact. When she wore jewelry that had "gold plating" she developed a rash at contact. She can have exposure to stainless steel and 14 carrat Gold with no problem.  Past Medical History:  Diagnosis Date  . Allergy 1995   enviromental food, grasses, smoke  . Arthritis    knees  . Hyperlipidemia   . Hypertension     Past Surgical History:  Procedure Laterality Date  . DILATION AND CURETTAGE OF UTERUS  2014   removal of benign polyps  . EYE SURGERY  2012   laser to repair torn retina  . LITHOTRIPSY  2010  . TONSILLECTOMY  1965  . TONSILLECTOMY AND ADENOIDECTOMY  1965  . URETEROSCOPY  2010    Allergies as of 01/28/2017      Reactions   Midazolam Other (See Comments)   (Versed) Reaction during lithotripsy      Medication List      ALEVE 220 MG Caps Generic drug:  Naproxen Sodium Take by mouth.   lisinopril 30 MG tablet Commonly known as:  PRINIVIL,ZESTRIL Take 60 mg by mouth at bedtime.   multivitamin with minerals tablet Take 1 tablet by mouth daily.   rosuvastatin 5 MG tablet Commonly known as:  CRESTOR Take 5 mg by mouth at bedtime.   Vitamin D-3 1000 units Caps Take by mouth.       Review of systems negative except as noted in HPI / PMHx or noted below:  Review of Systems  Constitutional: Negative.   HENT: Negative.   Eyes: Negative.   Respiratory: Negative.   Cardiovascular: Negative.   Gastrointestinal: Negative.   Genitourinary: Negative.   Musculoskeletal: Negative.   Skin: Negative.   Neurological: Negative.   Endo/Heme/Allergies: Negative.   Psychiatric/Behavioral: Negative.     Family History  Problem Relation Age of Onset  . Arthritis Mother   . COPD Mother   . Depression Mother   . Hyperlipidemia Mother   . Hypertension Mother   . Allergic rhinitis Mother   . Cancer Father   . Hyperlipidemia Father   . Hyperlipidemia Brother   . Hypertension Brother   . Cancer Maternal Grandmother   . Alcohol abuse Maternal Grandfather   . Stroke Maternal Grandfather   .  Cancer Paternal Grandmother   . Dementia Paternal Grandmother   . Stroke Paternal Grandfather   . Asthma Neg Hx     Social History   Social History  . Marital status: Widowed    Spouse name: N/A  . Number of children: N/A  . Years of education: N/A   Occupational History  . Not on file.   Social History Main Topics  . Smoking status: Never Smoker  . Smokeless tobacco: Never Used  . Alcohol use 0.6 oz/week    1 Glasses of wine per week     Comment: socially  . Drug use: No  . Sexual activity: No   Other Topics Concern  . Not on file   Social History Narrative  . No narrative on file      Environmental and Social history  Lives in a house with a dry environment, a dog and cat located inside the household, hardwood in the bedroom, no plastic on the bed, no plastic on the pillow, no smoking with inside the household. She is a Environmental manager.  Objective:   Vitals:   01/28/17 0855  BP: 130/74  Pulse: 84  Resp: 16  Temp: 98.1 F (36.7 C)   Height: 5' (152.4 cm) Weight: 174 lb 3.2 oz (79 kg)  Physical Exam  Constitutional: She is well-developed, well-nourished, and in no distress.  HENT:  Head: Normocephalic.  Right Ear: Tympanic membrane, external ear and ear canal normal.  Left Ear: Tympanic membrane, external ear and ear canal normal.  Nose: Nose normal. No mucosal edema or rhinorrhea.  Mouth/Throat: Uvula is midline, oropharynx is clear and moist and mucous membranes are normal. No oropharyngeal exudate.  Eyes: Conjunctivae are normal.  Neck: Trachea normal. No tracheal tenderness present. No tracheal deviation present. No thyromegaly present.  Cardiovascular: Normal rate, regular rhythm, S1 normal, S2 normal and normal heart sounds.   No murmur heard. Pulmonary/Chest: Breath sounds normal. No stridor. No respiratory distress. She has no wheezes. She has no rales.  Musculoskeletal: She exhibits no edema.  Lymphadenopathy:       Head (right side): No tonsillar adenopathy present.       Head (left side): No tonsillar adenopathy present.    She has no cervical adenopathy.  Neurological: She is alert. Gait normal.  Skin: No rash noted. She is not diaphoretic. No erythema. Nails show no clubbing.  Psychiatric: Mood and affect normal.    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

## 2017-01-30 NOTE — Progress Notes (Signed)
Preop on 7/3.  Need orders in epic 

## 2017-01-31 ENCOUNTER — Ambulatory Visit: Payer: Self-pay | Admitting: Orthopedic Surgery

## 2017-01-31 ENCOUNTER — Ambulatory Visit: Payer: BLUE CROSS/BLUE SHIELD | Admitting: Allergy

## 2017-01-31 DIAGNOSIS — Z9109 Other allergy status, other than to drugs and biological substances: Secondary | ICD-10-CM

## 2017-01-31 NOTE — Progress Notes (Signed)
    Follow-up Note  RE: Heather PortelaMelinda Lambert MRN: [id number] DOB: [birth year] Date of Office Visit: 01/31/2017  Primary care provider: Lewis Moccasinewey, [name] R, MD Referring provider: Lewis Moccasinewey, [name] R, MD   Heather AlcideMelinda returns to the office today for the initial metal patch test interpretation, given suspected history of contact dermatitis.    Diagnostics:  Metal patch testing 48 hour reading: Nickel - Erythematous with papules.  Rest of the panel is negative.  Plan:  Allergic contact dermatitis The patient has been provided detailed information regarding the substances she is sensitive to, as well as products containing the substances.  Meticulous avoidance of these substances is recommended. If avoidance is not possible, the use of barrier creams or lotions is recommended.  She will notify her surgeon is is Nickel allergic.    Heather AyeShaylar Mayur Duman, MD [facility]

## 2017-02-03 NOTE — Patient Instructions (Signed)
Heather PortelaMelinda Lambert  02/03/2017   Your procedure is scheduled on:02/10/17   [facility] .             Report to admitting at   0905 AM   Call this number if you have problems the morning of surgery  [phone]   Remember: ONLY 1 PERSON MAY GO WITH YOU TO SHORT STAY TO GET  READY MORNING OF YOUR SURGERY.  Do not eat food or drink liquids :After Midnight.     Take these medicines the morning of surgery with A SIP OF WATER:eye drops as needed                                You may not have any metal on your body including hair pins and              piercings  Do not wear jewelry, make-up, lotions, powders or perfumes, deodorant             Do not wear nail polish.  Do not shave  48 hours prior to surgery.            Do not bring valuables to the hospital. [facility] IS NOT             RESPONSIBLE   FOR VALUABLES.  Contacts, dentures or bridgework may not be worn into surgery.  Leave suitcase in the car. After surgery it may be brought to your room.                  Please read over the following fact sheets you were given: _____________________________________________________________________           [facility] - Pre[facility] Before surgery, you can play an important role.  Because skin is not sterile, your skin needs to be as free of germs as possible.  You can reduce the number of germs on your skin by washing with CHG (chlorahexidine gluconate) soap before surgery.  CHG is an antiseptic cleaner which kills germs and bonds with the skin to continue killing germs even after washing. Please DO NOT use if you have an allergy to CHG or antibacterial soaps.  If your skin becomes reddened/irritated stop using the CHG and inform your nurse when you arrive at Short Stay. Do not shave (including legs and underarms) for at least 48 hours prior to the first CHG shower.  You may shave your face/neck. Please follow these instructions  carefully:  1.  Shower with CHG Soap the night before surgery and the  morning of Surgery.  2.  If you choose to wash your hair, wash your hair first as usual with your  normal  shampoo.  3.  After you shampoo, rinse your hair and body thoroughly to remove the  shampoo.                           4.  Use CHG as you would any other liquid soap.  You can apply chg directly  to the skin and wash                       Gently with a scrungie or clean washcloth.  5.  Apply the CHG Soap to your  body ONLY FROM THE NECK DOWN.   Do not use on face/ open                           Wound or open sores. Avoid contact with eyes, ears mouth and genitals (private parts).                       Wash face,  Genitals (private parts) with your normal soap.             6.  Wash thoroughly, paying special attention to the area where your surgery  will be performed.  7.  Thoroughly rinse your body with warm water from the neck down.  8.  DO NOT shower/wash with your normal soap after using and rinsing off  the CHG Soap.                9.  Pat yourself dry with a clean towel.            10.  Wear clean pajamas.            11.  Place clean sheets on your bed the night of your first shower and do not  sleep with pets. Day of Surgery : Do not apply any lotions/deodorants the morning of surgery.  Please wear clean clothes to the hospital/surgery center.  FAILURE TO FOLLOW THESE INSTRUCTIONS MAY RESULT IN THE CANCELLATION OF YOUR SURGERY PATIENT SIGNATURE_________________________________  NURSE SIGNATURE__________________________________  ________________________________________________________________________  WHAT IS A BLOOD TRANSFUSION? Blood Transfusion Information  A transfusion is the replacement of blood or some of its parts. Blood is made up of multiple cells which provide different functions.  Red blood cells carry oxygen and are used for blood loss replacement.  White blood cells fight against  infection.  Platelets control bleeding.  Plasma helps clot blood.  Other blood products are available for specialized needs, such as hemophilia or other clotting disorders. BEFORE THE TRANSFUSION  Who gives blood for transfusions?   Healthy volunteers who are fully evaluated to make sure their blood is safe. This is blood bank blood. Transfusion therapy is the safest it has ever been in the practice of medicine. Before blood is taken from a donor, a complete history is taken to make sure that person has no history of diseases nor engages in risky social behavior (examples are intravenous drug use or sexual activity with multiple partners). The donor's travel history is screened to minimize risk of transmitting infections, such as malaria. The donated blood is tested for signs of infectious diseases, such as HIV and hepatitis. The blood is then tested to be sure it is compatible with you in order to minimize the chance of a transfusion reaction. If you or a relative donates blood, this is often done in anticipation of surgery and is not appropriate for emergency situations. It takes many days to process the donated blood. RISKS AND COMPLICATIONS Although transfusion therapy is very safe and saves many lives, the main dangers of transfusion include:   Getting an infectious disease.  Developing a transfusion reaction. This is an allergic reaction to something in the blood you were given. Every precaution is taken to prevent this. The decision to have a blood transfusion has been considered carefully by your caregiver before blood is given. Blood is not given unless the benefits outweigh the risks. AFTER THE TRANSFUSION  Right after receiving a blood transfusion, you will usually feel  much better and more energetic. This is especially true if your red blood cells have gotten low (anemic). The transfusion raises the level of the red blood cells which carry oxygen, and this usually causes an energy  increase.  The nurse administering the transfusion will monitor you carefully for complications. HOME CARE INSTRUCTIONS  No special instructions are needed after a transfusion. You may find your energy is better. Speak with your caregiver about any limitations on activity for underlying diseases you may have. SEEK MEDICAL CARE IF:   Your condition is not improving after your transfusion.  You develop redness or irritation at the intravenous (IV) site. SEEK IMMEDIATE MEDICAL CARE IF:  Any of the following symptoms occur over the next 12 hours:  Shaking chills.  You have a temperature by mouth above 102 F (38.9 C), not controlled by medicine.  Chest, back, or muscle pain.  People around you feel you are not acting correctly or are confused.  Shortness of breath or difficulty breathing.  Dizziness and fainting.  You get a rash or develop hives.  You have a decrease in urine output.  Your urine turns a dark color or changes to pink, red, or brown. Any of the following symptoms occur over the next 10 days:  You have a temperature by mouth above 102 F (38.9 C), not controlled by medicine.  Shortness of breath.  Weakness after normal activity.  The white part of the eye turns yellow (jaundice).  You have a decrease in the amount of urine or are urinating less often.  Your urine turns a dark color or changes to pink, red, or brown. Document Released: 07/19/2000 Document Revised: 10/14/2011 Document Reviewed: 03/07/2008 ExitCare Patient Information 2014 [place].  _______________________________________________________________________  Incentive Spirometer  An incentive spirometer is a tool that can help keep your lungs clear and active. This tool measures how well you are filling your lungs with each breath. Taking long deep breaths may help reverse or decrease the chance of developing breathing (pulmonary) problems (especially infection) following:  A long  period of time when you are unable to move or be active. BEFORE THE PROCEDURE   If the spirometer includes an indicator to show your best effort, your nurse or respiratory therapist will set it to a desired goal.  If possible, sit up straight or lean slightly forward. Try not to slouch.  Hold the incentive spirometer in an upright position. INSTRUCTIONS FOR USE  1. Sit on the edge of your bed if possible, or sit up as far as you can in bed or on a chair. 2. Hold the incentive spirometer in an upright position. 3. Breathe out normally. 4. Place the mouthpiece in your mouth and seal your lips tightly around it. 5. Breathe in slowly and as deeply as possible, raising the piston or the ball toward the top of the column. 6. Hold your breath for 3-5 seconds or for as long as possible. Allow the piston or ball to fall to the bottom of the column. 7. Remove the mouthpiece from your mouth and breathe out normally. 8. Rest for a few seconds and repeat Steps 1 through 7 at least 10 times every 1-2 hours when you are awake. Take your time and take a few normal breaths between deep breaths. 9. The spirometer may include an indicator to show your best effort. Use the indicator as a goal to work toward during each repetition. 10. After each set of 10 deep breaths, practice coughing to be  sure your lungs are clear. If you have an incision (the cut made at the time of surgery), support your incision when coughing by placing a pillow or rolled up towels firmly against it. Once you are able to get out of bed, walk around indoors and cough well. You may stop using the incentive spirometer when instructed by your caregiver.  RISKS AND COMPLICATIONS  Take your time so you do not get dizzy or light-headed.  If you are in pain, you may need to take or ask for pain medication before doing incentive spirometry. It is harder to take a deep breath if you are having pain. AFTER USE  Rest and breathe slowly and  easily.  It can be helpful to keep track of a log of your progress. Your caregiver can provide you with a simple table to help with this. If you are using the spirometer at home, follow these instructions: [place] IF:   You are having difficultly using the spirometer.  You have trouble using the spirometer as often as instructed.  Your pain medication is not giving enough relief while using the spirometer.  You develop fever of 100.5 F (38.1 C) or higher. SEEK IMMEDIATE MEDICAL CARE IF:   You cough up bloody sputum that had not been present before.  You develop fever of 102 F (38.9 C) or greater.  You develop worsening pain at or near the incision site. MAKE SURE YOU:   Understand these instructions.  Will watch your condition.  Will get help right away if you are not doing well or get worse. Document Released: 12/02/2006 Document Revised: 10/14/2011 Document Reviewed: 02/02/2007 [facility] Patient Information 2014 [place], [place].   ________________________________________________________________________

## 2017-02-04 ENCOUNTER — Encounter (HOSPITAL_COMMUNITY): Payer: Self-pay

## 2017-02-04 ENCOUNTER — Encounter (HOSPITAL_COMMUNITY)
Admission: RE | Admit: 2017-02-04 | Discharge: 2017-02-04 | Disposition: A | Discharge status: Home / Self Care | Payer: BLUE CROSS/BLUE SHIELD | Source: Ambulatory Visit | Attending: Orthopedic Surgery | Admitting: Orthopedic Surgery

## 2017-02-04 DIAGNOSIS — Z01812 Encounter for preprocedural laboratory examination: Secondary | ICD-10-CM | POA: Insufficient documentation

## 2017-02-04 DIAGNOSIS — M1712 Unilateral primary osteoarthritis, left knee: Secondary | ICD-10-CM | POA: Insufficient documentation

## 2017-02-04 DIAGNOSIS — Z01810 Encounter for preprocedural cardiovascular examination: Secondary | ICD-10-CM | POA: Insufficient documentation

## 2017-02-04 HISTORY — DX: Pneumonia, unspecified organism: J18.9

## 2017-02-04 HISTORY — DX: Cardiac murmur, unspecified: R01.1

## 2017-02-04 HISTORY — DX: Gastro-esophageal reflux disease without esophagitis: K21.9

## 2017-02-04 HISTORY — DX: Personal history of urinary calculi: Z87.442

## 2017-02-04 LAB — SURGICAL PCR SCREEN
MRSA, PCR: NEGATIVE
Staphylococcus aureus: POSITIVE — AB

## 2017-02-04 LAB — COMPREHENSIVE METABOLIC PANEL
ALBUMIN: 4.7 g/dL (ref 3.5–5.0)
ALT: 20 U/L (ref 14–54)
ANION GAP: 9 (ref 5–15)
AST: 22 U/L (ref 15–41)
Alkaline Phosphatase: 71 U/L (ref 38–126)
BILIRUBIN TOTAL: 0.7 mg/dL (ref 0.3–1.2)
BUN: 17 mg/dL (ref 6–20)
CO2: 23 mmol/L (ref 22–32)
Calcium: 9.5 mg/dL (ref 8.9–10.3)
Chloride: 109 mmol/L (ref 101–111)
Creatinine, Ser: 0.64 mg/dL (ref 0.44–1.00)
GFR calc Af Amer: >60 mL/min (ref >60)
Glucose, Bld: 89 mg/dL (ref 65–99)
POTASSIUM: 4.1 mmol/L (ref 3.5–5.1)
Sodium: 141 mmol/L (ref 135–145)
TOTAL PROTEIN: 7.4 g/dL (ref 6.5–8.1)

## 2017-02-04 LAB — APTT: APTT: 27 s (ref 24–36)

## 2017-02-04 LAB — CBC
HEMATOCRIT: 44.2 % (ref 36.0–46.0)
Hemoglobin: 15.4 g/dL — ABNORMAL HIGH (ref 12.0–15.0)
MCH: 30.2 pg (ref 26.0–34.0)
MCHC: 34.8 g/dL (ref 30.0–36.0)
MCV: 86.7 fL (ref 78.0–100.0)
Platelets: 282 10*3/uL (ref 150–400)
RBC: 5.1 MIL/uL (ref 3.87–5.11)
RDW: 12.5 % (ref 11.5–15.5)
WBC: 5.8 10*3/uL (ref 4.0–10.5)

## 2017-02-04 LAB — ABO/RH: ABO/RH(D): O POS

## 2017-02-04 LAB — PROTIME-INR
INR: 1.02
PROTHROMBIN TIME: 13.4 s (ref 11.4–15.2)

## 2017-02-04 NOTE — Progress Notes (Signed)
Clearance 03/18/17 Dr. Duanne Guessewey on chart

## 2017-02-09 ENCOUNTER — Ambulatory Visit: Payer: Self-pay | Admitting: Orthopedic Surgery

## 2017-02-09 NOTE — H&P (Signed)
Heather Lambert DOB: 18-Mar-1956 Widowed / Language: English / Race: White Female Date of Admission:  02/10/2017 CC:  Left Knee Pain History of Present Illness The patient is a 61 year old female who comes in for a preoperative History and Physical. The patient is scheduled for a left total knee arthroplasty to be performed by Dr. Gus Rankin. Aluisio, MD at [facility] on 02/10/2017. The patient is a 61 year old female who presented with knee complaints. The patient reports left knee (worse than right) symptoms including: pain which began 4 year(s) ago without any known injury (onset after walking the dogs. She does state that she was diagnosed with osteoarthritis in 2006).The patient feels that the symptoms are worsening. The patient has the current diagnosis of knee osteoarthritis. Prior to being seen, the patient was previously evaluated by a colleague 3 year(s) ago. Previous work-up for this problem has included knee x-rays. Past treatment for this problem has included application of ice, application of heat, restricted activities and physical therapy. and include knee pain, stiffness, decreased range of motion, locking, difficulty bearing weight and difficulty ambulating. Current treatment includes application of heat, application of ice, restricted activity (she has been doing some stretching exercises, but can not really ride her bike anymore) and nonsteroidal anti-inflammatory drugs (Aleve, prn). Both knees have been very problematic for a long time now. Left knee has always been a little worse than the right. She has lost motion in her knees. It is getting harder to walk. It is affecting her back and her hips. She is at a stage now where she is having a very hard time functioning because of the knee pain and knee dysfunction. She has tried exercising which has not helped. She has had injections in the past which have not helped. She is ready to get the knees fixed at this time. They have been  treated conservatively in the past for the above stated problem and despite conservative measures, they continue to have progressive pain and severe functional limitations and dysfunction. They have failed non-operative management including home exercise, medications, and injections. It is felt that they would benefit from undergoing total joint replacement. Risks and benefits of the procedure have been discussed with the patient and they elect to proceed with surgery. There are no active contraindications to surgery such as ongoing infection or rapidly progressive neurological disease.  Problem List/Past Medical  Primary osteoarthritis of both knees (M17.0)  High blood pressure  Hypercholesterolemia  Kidney Stone  Osteoarthritis  Hemorrhoids  Urinary Tract Infection  Menopause   Allergies  Versed *HYPNOTICS/SEDATIVES/SLEEP DISORDER AGENTS*  Metal  States that she con only wear 14-karat gold jewelry, allergic to "silver, nickle, and gold-plated" Nickel Allergy  Family History Cancer  Father, Maternal Grandmother, Paternal Grandmother. Cerebrovascular Accident  Paternal Grandfather. Chronic Obstructive Lung Disease  Mother. Depression  Mother. Hypertension  Brother, Father, Mother. Osteoarthritis  Mother. Father  Deceased, Lung Cancer, Melanoma. age 60 Mother  Deceased, Hypertension. age 86 Siblings  Brother - blood pressure, cholesterol, sleep apnea  Social History  Children  0 Current drinker  11/22/2016: Currently drinks wine only occasionally per week Current work status  working full time Exercise  Moderate, 30 - 45 minutes, cycling, weight training, other type. Exercises daily; does other and gym / weights / stepper Living situation  live alone Marital status  widowed No history of drug/alcohol rehab  Not under pain contract  Number of flights of stairs before winded  4-5 Tobacco / smoke exposure  11/22/2016: no Tobacco use  Never smoker.  11/22/2016 Alcohol use  Occasional alcohol use, Drinks wine. Advance Directives  Living Will, Healthcare POA Post-Surgical Plans  Home with Friends  Medication History Aleve (220MG  Tablet, Oral as needed) Active. Lisinopril (30MG  Tablet, Oral) Active. Multivitamin (Oral) Active. Vitamin D (Oral) Specific strength unknown - Active. Rosuvastatin Calcium (5MG  Tablet, Oral) Active.   Past Surgical History Dilation and Curettage of Uterus  Tonsils/Adnoids  Lithotripsy and Cystoscopy  Retinal Laser Surgery    Review of Systems  General Not Present- Chills, Fever and Night Sweats. HEENT Not Present- Hearing problems and Vision problems. Respiratory Not Present- Shortness of breath. Cardiovascular Present- Hypertension. Gastrointestinal Not Present- Constipation, Diarrhea, Heartburn, Nausea and Vomiting. Female Genitourinary Not Present- Blood in Urine and Urgency. Musculoskeletal Present- Decreased Range of Motion, Joint Pain, Joint Stiffness, Morning Stiffness and Muscle Cramps.  Vitals Weight: 175 lb Height: 61.5in Weight was reported by patient. Height was reported by patient. Body Surface Area: 1.8 m Body Mass Index: 32.53 kg/m  Pulse: 76 (Regular)  BP: 148/88 (Sitting, Left Arm, Standard)   Physical Exam General Mental Status -Alert, cooperative and good historian. General Appearance-pleasant, Not in acute distress. Orientation-Oriented X3. Build & Nutrition-Well nourished and Well developed.  Head and Neck Head-normocephalic, atraumatic . Neck Global Assessment - supple, no bruit auscultated on the right, no bruit auscultated on the left.  Eye Vision-Wears corrective lenses. Pupil - Bilateral-Regular and Round. Motion - Bilateral-EOMI.  Chest and Lung Exam Auscultation Breath sounds - clear at anterior chest wall and clear at posterior chest wall. Adventitious sounds - No Adventitious  sounds.  Cardiovascular Auscultation Rhythm - Regular rate and rhythm. Heart Sounds - S1 WNL and S2 WNL. Murmurs & Other Heart Sounds - Auscultation of the heart reveals - No Murmurs.  Abdomen Palpation/Percussion Tenderness - Abdomen is non-tender to palpation. Rigidity (guarding) - Abdomen is soft. Auscultation Auscultation of the abdomen reveals - Bowel sounds normal.  Female Genitourinary Note: Not done, not pertinent to present illness   Musculoskeletal Note: On exam, she is alert and oriented, in no apparent distress. Both hips have normal range of motion without discomfort. Her left knee shows varus deformity, range is 15 to 80 with no instability. She is tender in medial and lateral. Right knee, no effusion. Range is 10 to 110. Marked crepitus on range of motion. Tender medial greater than lateral with no instability.  RADIOGRAPHS AP and lateral of both knees show that she has severe end-stage arthritis of both knees, medial and patellofemoral with lateral involvement also. Left knee is slightly worse than the right, but both are severely affected.  Assessment & Plan Primary osteoarthritis of both knees (M17.0)  Note:Surgical Plans: Left Total Knee Replacement  Disposition: Home  PCP: Dr. Duanne Guessewey - patient was seen preop for surgical clearance.  IV TXA  Anesthesia Issues: Questionable reaction to Versed  Patient was instructed on what medications to stop prior to surgery.  Please note that the patient was referred to an Allergist preop for metal testing prior to surgery. 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 She will need the Asculap prosthesis.  Signed electronically by Lauraine RinneAlexzandrew L Buford Bremer, III PA-C

## 2017-02-10 ENCOUNTER — Encounter (HOSPITAL_COMMUNITY)
Admission: RE | Disposition: A | Discharge status: Home / Self Care | Payer: Self-pay | Source: Ambulatory Visit | Attending: Orthopedic Surgery

## 2017-02-10 ENCOUNTER — Inpatient Hospital Stay (HOSPITAL_COMMUNITY): Payer: BLUE CROSS/BLUE SHIELD | Admitting: Anesthesiology

## 2017-02-10 ENCOUNTER — Encounter (HOSPITAL_COMMUNITY): Payer: Self-pay

## 2017-02-10 ENCOUNTER — Inpatient Hospital Stay (HOSPITAL_COMMUNITY)
Admission: RE | Admit: 2017-02-10 | Discharge: 2017-02-12 | DRG: 470 | Disposition: A | Discharge status: Home / Self Care | Payer: BLUE CROSS/BLUE SHIELD | Source: Ambulatory Visit | Attending: Orthopedic Surgery | Admitting: Orthopedic Surgery

## 2017-02-10 DIAGNOSIS — Z8249 Family history of ischemic heart disease and other diseases of the circulatory system: Secondary | ICD-10-CM

## 2017-02-10 DIAGNOSIS — Z801 Family history of malignant neoplasm of trachea, bronchus and lung: Secondary | ICD-10-CM

## 2017-02-10 DIAGNOSIS — M1712 Unilateral primary osteoarthritis, left knee: Secondary | ICD-10-CM | POA: Diagnosis not present

## 2017-02-10 DIAGNOSIS — E7800 Pure hypercholesterolemia, unspecified: Secondary | ICD-10-CM | POA: Diagnosis not present

## 2017-02-10 DIAGNOSIS — Z87442 Personal history of urinary calculi: Secondary | ICD-10-CM

## 2017-02-10 DIAGNOSIS — M170 Bilateral primary osteoarthritis of knee: Principal | ICD-10-CM | POA: Diagnosis present

## 2017-02-10 DIAGNOSIS — M179 Osteoarthritis of knee, unspecified: Secondary | ICD-10-CM

## 2017-02-10 DIAGNOSIS — I10 Essential (primary) hypertension: Secondary | ICD-10-CM | POA: Diagnosis present

## 2017-02-10 DIAGNOSIS — M1710 Unilateral primary osteoarthritis, unspecified knee: Secondary | ICD-10-CM | POA: Diagnosis present

## 2017-02-10 DIAGNOSIS — Z808 Family history of malignant neoplasm of other organs or systems: Secondary | ICD-10-CM

## 2017-02-10 DIAGNOSIS — Z825 Family history of asthma and other chronic lower respiratory diseases: Secondary | ICD-10-CM

## 2017-02-10 DIAGNOSIS — G8918 Other acute postprocedural pain: Secondary | ICD-10-CM | POA: Diagnosis not present

## 2017-02-10 DIAGNOSIS — E785 Hyperlipidemia, unspecified: Secondary | ICD-10-CM | POA: Diagnosis not present

## 2017-02-10 HISTORY — PX: TOTAL KNEE ARTHROPLASTY: SHX125

## 2017-02-10 LAB — TYPE AND SCREEN
ABO/RH(D): O POS
Antibody Screen: NEGATIVE

## 2017-02-10 SURGERY — ARTHROPLASTY, KNEE, TOTAL
Anesthesia: Spinal | Site: Knee | Laterality: Left

## 2017-02-10 MED ORDER — DEXAMETHASONE SODIUM PHOSPHATE 10 MG/ML IJ SOLN
INTRAMUSCULAR | Status: AC
  Filled 2017-02-10: qty 1

## 2017-02-10 MED ORDER — TRAMADOL HCL 50 MG PO TABS: 50.0000 mg | ORAL_TABLET | Freq: Four times a day (QID) | ORAL | Status: DC | PRN

## 2017-02-10 MED ORDER — GABAPENTIN 300 MG PO CAPS
300.0000 mg | ORAL_CAPSULE | Freq: Once | ORAL | Status: AC
  Administered 2017-02-10: 300 mg via ORAL

## 2017-02-10 MED ORDER — BUPIVACAINE IN DEXTROSE 0.75-8.25 % IT SOLN
INTRATHECAL | Status: DC | PRN
  Administered 2017-02-10: 1.8 mL via INTRATHECAL

## 2017-02-10 MED ORDER — BUPIVACAINE LIPOSOME 1.3 % IJ SUSP
20.0000 mL | Freq: Once | INTRAMUSCULAR | Status: DC
  Filled 2017-02-10: qty 20

## 2017-02-10 MED ORDER — CEFAZOLIN SODIUM-DEXTROSE 2-4 GM/100ML-% IV SOLN
INTRAVENOUS | Status: AC
  Filled 2017-02-10: qty 100

## 2017-02-10 MED ORDER — PHENYLEPHRINE HCL 10 MG/ML IJ SOLN
INTRAVENOUS | Status: DC | PRN
  Administered 2017-02-10: 40 ug/min via INTRAVENOUS

## 2017-02-10 MED ORDER — CEFAZOLIN SODIUM-DEXTROSE 2-4 GM/100ML-% IV SOLN
2.0000 g | Freq: Four times a day (QID) | INTRAVENOUS | Status: AC
  Administered 2017-02-10 – 2017-02-11 (×2): 2 g via INTRAVENOUS
  Filled 2017-02-10 (×2): qty 100

## 2017-02-10 MED ORDER — SODIUM CHLORIDE 0.9 % IJ SOLN
INTRAMUSCULAR | Status: DC | PRN
  Administered 2017-02-10: 30 mL

## 2017-02-10 MED ORDER — MORPHINE SULFATE (PF) 4 MG/ML IV SOLN: 1.0000 mg | INTRAVENOUS | Status: DC | PRN

## 2017-02-10 MED ORDER — LACTATED RINGERS IV SOLN
INTRAVENOUS | Status: DC
Start: 2017-02-10 — End: 2017-02-10
  Administered 2017-02-10: 13:00:00 via INTRAVENOUS
  Administered 2017-02-10: 1000 mL via INTRAVENOUS

## 2017-02-10 MED ORDER — ONDANSETRON HCL 4 MG/2ML IJ SOLN
INTRAMUSCULAR | Status: DC | PRN
  Administered 2017-02-10: 4 mg via INTRAVENOUS

## 2017-02-10 MED ORDER — METOCLOPRAMIDE HCL 5 MG PO TABS: 5.0000 mg | ORAL_TABLET | Freq: Three times a day (TID) | ORAL | Status: DC | PRN

## 2017-02-10 MED ORDER — BUPIVACAINE-EPINEPHRINE (PF) 0.5% -1:200000 IJ SOLN
INTRAMUSCULAR | Status: DC | PRN
  Administered 2017-02-10: 30 mL via PERINEURAL

## 2017-02-10 MED ORDER — SODIUM CHLORIDE 0.9 % IV SOLN
INTRAVENOUS | Status: DC
  Administered 2017-02-10 – 2017-02-11 (×2): via INTRAVENOUS

## 2017-02-10 MED ORDER — ACETAMINOPHEN 10 MG/ML IV SOLN
1000.0000 mg | Freq: Once | INTRAVENOUS | Status: AC
  Administered 2017-02-10: 1000 mg via INTRAVENOUS

## 2017-02-10 MED ORDER — DOCUSATE SODIUM 100 MG PO CAPS
100.0000 mg | ORAL_CAPSULE | Freq: Two times a day (BID) | ORAL | Status: DC
  Administered 2017-02-10 – 2017-02-12 (×4): 100 mg via ORAL
  Filled 2017-02-10 (×4): qty 1

## 2017-02-10 MED ORDER — ACETAMINOPHEN 10 MG/ML IV SOLN
INTRAVENOUS | Status: AC
  Filled 2017-02-10: qty 100

## 2017-02-10 MED ORDER — LACTATED RINGERS IV SOLN: INTRAVENOUS | Status: DC

## 2017-02-10 MED ORDER — PHENYLEPHRINE HCL 10 MG/ML IJ SOLN
INTRAMUSCULAR | Status: AC
  Filled 2017-02-10: qty 1

## 2017-02-10 MED ORDER — METOCLOPRAMIDE HCL 5 MG/ML IJ SOLN: 5.0000 mg | Freq: Three times a day (TID) | INTRAMUSCULAR | Status: DC | PRN

## 2017-02-10 MED ORDER — OXYCODONE HCL 5 MG PO TABS
5.0000 mg | ORAL_TABLET | ORAL | Status: DC | PRN
  Administered 2017-02-10 (×2): 5 mg via ORAL
  Administered 2017-02-11 (×4): 10 mg via ORAL
  Administered 2017-02-11 (×2): 5 mg via ORAL
  Administered 2017-02-12 (×3): 10 mg via ORAL
  Filled 2017-02-10: qty 2
  Filled 2017-02-10: qty 1
  Filled 2017-02-10: qty 2
  Filled 2017-02-10: qty 1
  Filled 2017-02-10 (×6): qty 2
  Filled 2017-02-10: qty 1
  Filled 2017-02-10: qty 2

## 2017-02-10 MED ORDER — CHLORHEXIDINE GLUCONATE 4 % EX LIQD: 60.0000 mL | Freq: Once | CUTANEOUS | Status: DC

## 2017-02-10 MED ORDER — DEXAMETHASONE SODIUM PHOSPHATE 10 MG/ML IJ SOLN
10.0000 mg | Freq: Once | INTRAMUSCULAR | Status: AC
  Administered 2017-02-11: 10 mg via INTRAVENOUS
  Filled 2017-02-10: qty 1

## 2017-02-10 MED ORDER — ONDANSETRON HCL 4 MG/2ML IJ SOLN
INTRAMUSCULAR | Status: AC
  Filled 2017-02-10: qty 2

## 2017-02-10 MED ORDER — GABAPENTIN 300 MG PO CAPS
ORAL_CAPSULE | ORAL | Status: AC
  Administered 2017-02-10: 300 mg via ORAL
  Filled 2017-02-10: qty 1

## 2017-02-10 MED ORDER — ACETAMINOPHEN 325 MG PO TABS
650.0000 mg | ORAL_TABLET | Freq: Four times a day (QID) | ORAL | Status: DC | PRN
  Administered 2017-02-12: 650 mg via ORAL
  Filled 2017-02-10: qty 2

## 2017-02-10 MED ORDER — BUPIVACAINE LIPOSOME 1.3 % IJ SUSP
INTRAMUSCULAR | Status: DC | PRN
  Administered 2017-02-10: 20 mL

## 2017-02-10 MED ORDER — FENTANYL CITRATE (PF) 100 MCG/2ML IJ SOLN: 25.0000 ug | INTRAMUSCULAR | Status: DC | PRN

## 2017-02-10 MED ORDER — PROPOFOL 10 MG/ML IV BOLUS
INTRAVENOUS | Status: AC
  Filled 2017-02-10: qty 20

## 2017-02-10 MED ORDER — BISACODYL 10 MG RE SUPP: 10.0000 mg | Freq: Every day | RECTAL | Status: DC | PRN

## 2017-02-10 MED ORDER — DEXAMETHASONE SODIUM PHOSPHATE 10 MG/ML IJ SOLN
10.0000 mg | Freq: Once | INTRAMUSCULAR | Status: AC
  Administered 2017-02-10: 10 mg via INTRAVENOUS

## 2017-02-10 MED ORDER — LIDOCAINE 2% (20 MG/ML) 5 ML SYRINGE
INTRAMUSCULAR | Status: AC
  Filled 2017-02-10: qty 5

## 2017-02-10 MED ORDER — PHENOL 1.4 % MT LIQD
1.0000 | OROMUCOSAL | Status: DC | PRN
  Filled 2017-02-10: qty 177

## 2017-02-10 MED ORDER — STERILE WATER FOR IRRIGATION IR SOLN
Status: DC | PRN
  Administered 2017-02-10: 2000 mL

## 2017-02-10 MED ORDER — 0.9 % SODIUM CHLORIDE (POUR BTL) OPTIME
TOPICAL | Status: DC | PRN
  Administered 2017-02-10: 1000 mL

## 2017-02-10 MED ORDER — TRANEXAMIC ACID 1000 MG/10ML IV SOLN
1000.0000 mg | INTRAVENOUS | Status: AC
  Administered 2017-02-10: 1000 mg via INTRAVENOUS
  Filled 2017-02-10: qty 1100

## 2017-02-10 MED ORDER — MEPERIDINE HCL 50 MG/ML IJ SOLN: 6.2500 mg | INTRAMUSCULAR | Status: DC | PRN

## 2017-02-10 MED ORDER — FLEET ENEMA 7-19 GM/118ML RE ENEM: 1.0000 | ENEMA | Freq: Once | RECTAL | Status: DC | PRN

## 2017-02-10 MED ORDER — DIPHENHYDRAMINE HCL 12.5 MG/5ML PO ELIX: 12.5000 mg | ORAL_SOLUTION | ORAL | Status: DC | PRN

## 2017-02-10 MED ORDER — POLYETHYLENE GLYCOL 3350 17 G PO PACK: 17.0000 g | PACK | Freq: Every day | ORAL | Status: DC | PRN

## 2017-02-10 MED ORDER — SODIUM CHLORIDE 0.9 % IJ SOLN
INTRAMUSCULAR | Status: AC
  Filled 2017-02-10: qty 50

## 2017-02-10 MED ORDER — CEFAZOLIN SODIUM-DEXTROSE 2-4 GM/100ML-% IV SOLN
2.0000 g | INTRAVENOUS | Status: AC
  Administered 2017-02-10: 2 g via INTRAVENOUS

## 2017-02-10 MED ORDER — ROSUVASTATIN CALCIUM 5 MG PO TABS
5.0000 mg | ORAL_TABLET | Freq: Every day | ORAL | Status: DC
  Administered 2017-02-10 – 2017-02-11 (×2): 5 mg via ORAL
  Filled 2017-02-10 (×2): qty 1

## 2017-02-10 MED ORDER — ACETAMINOPHEN 500 MG PO TABS
1000.0000 mg | ORAL_TABLET | Freq: Four times a day (QID) | ORAL | Status: AC
  Administered 2017-02-10 – 2017-02-11 (×4): 1000 mg via ORAL
  Filled 2017-02-10 (×4): qty 2

## 2017-02-10 MED ORDER — METOCLOPRAMIDE HCL 5 MG/ML IJ SOLN: 10.0000 mg | Freq: Once | INTRAMUSCULAR | Status: DC | PRN

## 2017-02-10 MED ORDER — METHOCARBAMOL 500 MG PO TABS
500.0000 mg | ORAL_TABLET | Freq: Four times a day (QID) | ORAL | Status: DC | PRN
  Administered 2017-02-11 – 2017-02-12 (×3): 500 mg via ORAL
  Filled 2017-02-10 (×3): qty 1

## 2017-02-10 MED ORDER — RIVAROXABAN 10 MG PO TABS
10.0000 mg | ORAL_TABLET | Freq: Every day | ORAL | Status: DC
  Administered 2017-02-11 – 2017-02-12 (×2): 10 mg via ORAL
  Filled 2017-02-10 (×2): qty 1

## 2017-02-10 MED ORDER — MENTHOL 3 MG MT LOZG: 1.0000 | LOZENGE | OROMUCOSAL | Status: DC | PRN

## 2017-02-10 MED ORDER — TRANEXAMIC ACID 1000 MG/10ML IV SOLN
1000.0000 mg | Freq: Once | INTRAVENOUS | Status: AC
  Administered 2017-02-10: 1000 mg via INTRAVENOUS
  Filled 2017-02-10: qty 1100

## 2017-02-10 MED ORDER — FENTANYL CITRATE (PF) 100 MCG/2ML IJ SOLN
INTRAMUSCULAR | Status: AC
  Administered 2017-02-10: 100 ug via INTRAVENOUS
  Filled 2017-02-10: qty 2

## 2017-02-10 MED ORDER — ONDANSETRON HCL 4 MG/2ML IJ SOLN: 4.0000 mg | Freq: Four times a day (QID) | INTRAMUSCULAR | Status: DC | PRN

## 2017-02-10 MED ORDER — ONDANSETRON HCL 4 MG PO TABS: 4.0000 mg | ORAL_TABLET | Freq: Four times a day (QID) | ORAL | Status: DC | PRN

## 2017-02-10 MED ORDER — PROPOFOL 500 MG/50ML IV EMUL
INTRAVENOUS | Status: DC | PRN
  Administered 2017-02-10: 75 ug/kg/min via INTRAVENOUS

## 2017-02-10 MED ORDER — PROPOFOL 10 MG/ML IV BOLUS
INTRAVENOUS | Status: DC | PRN
  Administered 2017-02-10 (×2): 20 mg via INTRAVENOUS
  Administered 2017-02-10 (×2): 30 mg via INTRAVENOUS

## 2017-02-10 MED ORDER — METHOCARBAMOL 1000 MG/10ML IJ SOLN
500.0000 mg | Freq: Four times a day (QID) | INTRAMUSCULAR | Status: DC | PRN
  Administered 2017-02-10: 500 mg via INTRAVENOUS
  Filled 2017-02-10: qty 550

## 2017-02-10 MED ORDER — PROPOFOL 10 MG/ML IV BOLUS
INTRAVENOUS | Status: AC
  Filled 2017-02-10: qty 40

## 2017-02-10 MED ORDER — SODIUM CHLORIDE 0.9 % IR SOLN
Status: DC | PRN
  Administered 2017-02-10: 1000 mL

## 2017-02-10 MED ORDER — ACETAMINOPHEN 650 MG RE SUPP: 650.0000 mg | Freq: Four times a day (QID) | RECTAL | Status: DC | PRN

## 2017-02-10 MED ORDER — FENTANYL CITRATE (PF) 100 MCG/2ML IJ SOLN
100.0000 ug | Freq: Once | INTRAMUSCULAR | Status: AC
  Administered 2017-02-10: 100 ug via INTRAVENOUS

## 2017-02-10 SURGICAL SUPPLY — 49 items
BAG ZIPLOCK 12X15 (MISCELLANEOUS) ×2 IMPLANT
BANDAGE ACE 6X5 VEL STRL LF (GAUZE/BANDAGES/DRESSINGS) ×2 IMPLANT
BLADE SAG 18X100X1.27 (BLADE) ×2 IMPLANT
BLADE SAW SGTL 11.0X1.19X90.0M (BLADE) ×2 IMPLANT
BOWL SMART MIX CTS (DISPOSABLE) ×2 IMPLANT
CAP KNEE TOTAL 3 ×2 IMPLANT
CEMENT HV SMART SET (Cement) ×4 IMPLANT
COVER SURGICAL LIGHT HANDLE (MISCELLANEOUS) ×2 IMPLANT
CUFF TOURN SGL QUICK 34 (TOURNIQUET CUFF) ×1
CUFF TRNQT CYL 34X4X40X1 (TOURNIQUET CUFF) ×1 IMPLANT
DECANTER SPIKE VIAL GLASS SM (MISCELLANEOUS) ×2 IMPLANT
DRAPE U-SHAPE 47X51 STRL (DRAPES) ×2 IMPLANT
DRSG ADAPTIC 3X8 NADH LF (GAUZE/BANDAGES/DRESSINGS) ×2 IMPLANT
DRSG PAD ABDOMINAL 8X10 ST (GAUZE/BANDAGES/DRESSINGS) ×2 IMPLANT
DURAPREP 26ML APPLICATOR (WOUND CARE) ×2 IMPLANT
ELECT REM PT RETURN 15FT ADLT (MISCELLANEOUS) ×2 IMPLANT
EVACUATOR 1/8 PVC DRAIN (DRAIN) ×2 IMPLANT
GAUZE SPONGE 4X4 12PLY STRL (GAUZE/BANDAGES/DRESSINGS) ×2 IMPLANT
GLOVE BIO SURGEON STRL SZ8 (GLOVE) ×2 IMPLANT
GLOVE BIOGEL PI IND STRL 6.5 (GLOVE) ×2 IMPLANT
GLOVE BIOGEL PI IND STRL 7.5 (GLOVE) ×3 IMPLANT
GLOVE BIOGEL PI IND STRL 8 (GLOVE) ×1 IMPLANT
GLOVE BIOGEL PI INDICATOR 6.5 (GLOVE) ×2
GLOVE BIOGEL PI INDICATOR 7.5 (GLOVE) ×3
GLOVE BIOGEL PI INDICATOR 8 (GLOVE) ×1
GLOVE ECLIPSE 6.5 STRL STRAW (GLOVE) ×2 IMPLANT
GLOVE SURG SS PI 6.5 STRL IVOR (GLOVE) ×2 IMPLANT
GLOVE SURG SS PI 7.5 STRL IVOR (GLOVE) ×2 IMPLANT
GOWN STRL REUS W/ TWL XL LVL3 (GOWN DISPOSABLE) ×2 IMPLANT
GOWN STRL REUS W/TWL LRG LVL3 (GOWN DISPOSABLE) ×4 IMPLANT
GOWN STRL REUS W/TWL XL LVL3 (GOWN DISPOSABLE) ×2
HANDPIECE INTERPULSE COAX TIP (DISPOSABLE) ×1
IMMOBILIZER KNEE 20 (SOFTGOODS) ×2
IMMOBILIZER KNEE 20 THIGH 36 (SOFTGOODS) ×1 IMPLANT
MANIFOLD NEPTUNE II (INSTRUMENTS) ×2 IMPLANT
PACK TOTAL KNEE CUSTOM (KITS) ×2 IMPLANT
PADDING CAST COTTON 6X4 STRL (CAST SUPPLIES) ×6 IMPLANT
POSITIONER SURGICAL ARM (MISCELLANEOUS) ×2 IMPLANT
SET HNDPC FAN SPRY TIP SCT (DISPOSABLE) ×1 IMPLANT
STRIP CLOSURE SKIN 1/2X4 (GAUZE/BANDAGES/DRESSINGS) ×4 IMPLANT
SUT MNCRL AB 4-0 PS2 18 (SUTURE) ×2 IMPLANT
SUT STRATAFIX 0 PDS 27 VIOLET (SUTURE) ×2
SUT VIC AB 2-0 CT1 27 (SUTURE) ×3
SUT VIC AB 2-0 CT1 TAPERPNT 27 (SUTURE) ×3 IMPLANT
SUTURE STRATFX 0 PDS 27 VIOLET (SUTURE) ×1 IMPLANT
SYR 30ML LL (SYRINGE) ×4 IMPLANT
TRAY FOLEY CATH SILVER 14FR (SET/KITS/TRAYS/PACK) ×2 IMPLANT
WRAP KNEE MAXI GEL POST OP (GAUZE/BANDAGES/DRESSINGS) ×2 IMPLANT
YANKAUER SUCT BULB TIP 10FT TU (MISCELLANEOUS) ×2 IMPLANT

## 2017-02-10 NOTE — Anesthesia Procedure Notes (Signed)
Anesthesia Regional Block: Adductor canal block   Pre-Anesthetic Checklist: ,, timeout performed, Correct Patient, Correct Site, Correct Laterality, Correct Procedure, Correct Position, site marked, Risks and benefits discussed,  Surgical consent,  Pre-op evaluation,  At surgeon's request and post-op pain management  Laterality: Left and Lower  Prep: Maximum Sterile Barrier Precautions used, chloraprep       Needles:  Injection technique: Single-shot  Needle Type: Echogenic Stimulator Needle     Needle Length: 10cm      Additional Needles:   Procedures: ultrasound guided,,,,,,,,  Narrative:  Start time: 02/10/2017 10:48 AM End time: 02/10/2017 10:58 AM Injection made incrementally with aspirations every 5 mL.  Performed by: Personally  Anesthesiologist: Phillips GroutARIGNAN, Sister Carbone  Additional Notes: Risks, benefits and alternative to block explained extensively.  Patient tolerated procedure well, without complications.

## 2017-02-10 NOTE — Progress Notes (Signed)
AssistedDr. Carignan with left, ultrasound guided, adductor canal block. Side rails up, monitors on throughout procedure. See vital signs in flow sheet. Tolerated Procedure well.  

## 2017-02-10 NOTE — Transfer of Care (Signed)
Immediate Anesthesia Transfer of Care Note  Patient: Heather Lambert  Procedure(s) Performed: Procedure(s) with comments: LEFT TOTAL KNEE ARTHROPLASTY (Left) - Adductor Block  Patient Location: PACU  Anesthesia Type:Spinal  Level of Consciousness:  sedated, patient cooperative and responds to stimulation  Airway & Oxygen Therapy:Patient Spontanous Breathing and Patient connected to face mask oxgen  Post-op Assessment:  Report given to PACU RN and Post -op Vital signs reviewed and stable  Post vital signs:  Reviewed and stable  Last Vitals:  Vitals:   02/10/17 1105 02/10/17 1120  BP:  136/78  Pulse: 88 74  Resp: 16 10  Temp:      Complications: No apparent anesthesia complications

## 2017-02-10 NOTE — Op Note (Signed)
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 Athalene Kolle, MD  Assistant- Dimitri Ped, PA-C   Anesthesia-  Adductor canal block and spinal  EBL-* No blood loss amount entered *   Drains Hemovac  Tourniquet time- 42 minutes @ 300 mm Hg  Complications- None  Condition-PACU - hemodynamically stable.   Brief Clinical Note  Heather Lambert is a 61 y.o. year old female with end stage OA of her left knee with progressively worsening pain and dysfunction. She has constant pain, with activity and at rest and significant functional deficits with difficulties even with ADLs. She has had extensive non-op management including analgesics, injections of cortisone and viscosupplements, and home exercise program, but remains in significant pain with significant dysfunction. Radiographs show bone on bone arthritis all 3 compartments. She presents now for left Total Knee Arthroplasty.    Procedure in detail---   The patient is brought into the operating room and positioned supine on the operating table. After successful administration of  Adductor canal block and spinal,   a tourniquet is placed high on the  Left thigh(s) and the lower extremity is prepped and draped in the usual sterile fashion. Time out is performed by the operating team and then the  Left lower extremity is wrapped in Esmarch, knee flexed and the tourniquet inflated to 300 mmHg.       A midline incision is made with a ten blade through the subcutaneous tissue to the level of the extensor mechanism. A fresh blade is used to make a medial parapatellar arthrotomy. Soft tissue over the proximal medial tibia is subperiosteally elevated to the joint line with a knife and into the semimembranosus bursa with a Cobb  elevator. Soft tissue over the proximal lateral tibia is elevated with attention being paid to avoiding the patellar tendon on the tibial tubercle. The patella is everted, knee flexed 90 degrees and the ACL and PCL are removed. Findings are bone on bone all 3 compartments with massive global osteophytes.        The drill is used to create a starting hole in the distal femur and the canal is thoroughly irrigated with sterile saline to remove the fatty contents. The 5 degree Left  valgus alignment guide is placed into the femoral canal and the distal femoral cutting block is pinned to remove 10 mm off the distal femur. Resection is made with an oscillating saw.      The tibia is subluxed forward and the menisci are removed. The extramedullary alignment guide is placed referencing proximally at the medial aspect of the tibial tubercle and distally along the second metatarsal axis and tibial crest. The block is pinned to remove 2mm off the more deficient medial  side. Resection is made with an oscillating saw. Size 1is the most appropriate size for the tibia and the proximal tibia is prepared with the modular drill and keel punch for that size.      The femoral sizing guide is placed and size 3 is most appropriate. Rotation is marked off the epicondylar axis and confirmed by creating a rectangular flexion gap at 90 degrees. The size 3 cutting block is pinned in this rotation and the anterior, posterior and chamfer cuts are made with the oscillating saw. The intercondylar block is then placed and that cut is made.  Trial size 1 tibial component, trial size 3 narrow posterior stabilized femur and a 14  mm posterior stabilized insert trial is placed. Full extension is achieved with excellent varus/valgus and anterior/posterior balance throughout full range of motion. The patella is everted and thickness measured to be 21  mm. Free hand resection is taken to 12 mm, a 2 template is placed, lug holes are drilled,  trial patella is placed, and it tracks normally. Osteophytes are removed off the posterior femur with the trial in place. All trials are removed and the cut bone surfaces prepared with pulsatile lavage. Cement is mixed and once ready for implantation, the size 1 tibial implant, size  3 narrow posterior stabilized femoral component, and the size 2 patella are cemented in place and the patella is held with the clamp. The trial insert is placed and the knee held in full extension. The Exparel (20 ml mixed with 60 ml saline) is injected into the extensor mechanism, posterior capsule, medial and lateral gutters and subcutaneous tissues.  All extruded cement is removed and once the cement is hard the permanent 14 mm posterior stabilized rotating platform insert is placed into the tibial tray.      The wound is copiously irrigated with saline solution and the extensor mechanism closed over a hemovac drain with #1 V-loc suture. The tourniquet is released for a total tourniquet time of 42  minutes. Flexion against gravity is 140 degrees and the patella tracks normally. Subcutaneous tissue is closed with 2.0 vicryl and subcuticular with running 4.0 Monocryl. The incision is cleaned and dried and steri-strips and a bulky sterile dressing are applied. The limb is placed into a knee immobilizer and the patient is awakened and transported to recovery in stable condition.      Please note that a surgical assistant was a medical necessity for this procedure in order to perform it in a safe and expeditious manner. Surgical assistant was necessary to retract the ligaments and vital neurovascular structures to prevent injury to them and also necessary for proper positioning of the limb to allow for anatomic placement of the prosthesis.   Gus RankinFrank V. Jesiah Yerby, MD    02/10/2017, 12:46 PM

## 2017-02-10 NOTE — Discharge Instructions (Addendum)
° °Dr. Frank Aluisio °Total Joint Specialist °[facility] °[address] °[place], [place] [address] °[phone] ° °TOTAL KNEE REPLACEMENT POSTOPERATIVE DIRECTIONS ° °Knee Rehabilitation, Guidelines Following Surgery  °Results after knee surgery are often greatly improved when you follow the exercise, range of motion and muscle strengthening exercises prescribed by your doctor. Safety measures are also important to protect the knee from further injury. Any time any of these exercises cause you to have increased pain or swelling in your knee joint, decrease the amount until you are comfortable again and slowly increase them. If you have problems or questions, call your caregiver or physical therapist for advice.  ° °HOME CARE INSTRUCTIONS  °Remove items at home which could result in a fall. This includes throw rugs or furniture in walking pathways.  °· ICE to the affected knee every three hours for 30 minutes at a time and then as needed for pain and swelling.  Continue to use ice on the knee for pain and swelling from surgery. You may notice swelling that will progress down to the foot and ankle.  This is normal after surgery.  Elevate the leg when you are not up walking on it.   °· Continue to use the breathing machine which will help keep your temperature down.  It is common for your temperature to cycle up and down following surgery, especially at night when you are not up moving around and exerting yourself.  The breathing machine keeps your lungs expanded and your temperature down. °· Do not place pillow under knee, focus on keeping the knee straight while resting ° °DIET °You may resume your previous home diet once your are discharged from the hospital. ° °DRESSING / WOUND CARE / SHOWERING °You may shower 3 days after surgery, but keep the wounds dry during showering.  You may use an occlusive plastic wrap (Press'n Seal for example), NO SOAKING/SUBMERGING IN THE BATHTUB.  If the  bandage gets wet, change with a clean dry gauze.  If the incision gets wet, pat the wound dry with a clean towel. °You may start showering once you are discharged home but do not submerge the incision under water. Just pat the incision dry and apply a dry gauze dressing on daily. °Change the surgical dressing daily and reapply a dry dressing each time. ° °ACTIVITY °Walk with your walker as instructed. °Use walker as long as suggested by your caregivers. °Avoid periods of inactivity such as sitting longer than an hour when not asleep. This helps prevent blood clots.  °You may resume a sexual relationship in one month or when given the OK by your doctor.  °You may return to work once you are cleared by your doctor.  °Do not drive a car for 6 weeks or until released by you surgeon.  °Do not drive while taking narcotics. ° °WEIGHT BEARING °Weight bearing as tolerated with assist device (walker, cane, etc) as directed, use it as long as suggested by your surgeon or therapist, typically at least 4-6 weeks. ° °POSTOPERATIVE CONSTIPATION PROTOCOL °Constipation - defined medically as fewer than three stools per week and severe constipation as less than one stool per week. ° °One of the most common issues patients have following surgery is constipation.  Even if you have a regular bowel pattern at home, your normal regimen is likely to be disrupted due to multiple reasons following surgery.  Combination of anesthesia, postoperative narcotics, change in appetite and fluid intake all can affect your bowels.    In order to avoid complications following surgery, here are some recommendations in order to help you during your recovery period. ° °Colace (docusate) - Pick up an over-the-counter form of Colace or another stool softener and take twice a day as long as you are requiring postoperative pain medications.  Take with a full glass of water daily.  If you experience loose stools or diarrhea, hold the colace until you stool forms  back up.  If your symptoms do not get better within 1 week or if they get worse, check with your doctor. ° °Dulcolax (bisacodyl) - Pick up over-the-counter and take as directed by the product packaging as needed to assist with the movement of your bowels.  Take with a full glass of water.  Use this product as needed if not relieved by Colace only.  ° °MiraLax (polyethylene glycol) - Pick up over-the-counter to have on hand.  MiraLax is a solution that will increase the amount of water in your bowels to assist with bowel movements.  Take as directed and can mix with a glass of water, juice, soda, coffee, or tea.  Take if you go more than two days without a movement. °Do not use MiraLax more than once per day. Call your doctor if you are still constipated or irregular after using this medication for 7 days in a row. ° °If you continue to have problems with postoperative constipation, please contact the office for further assistance and recommendations.  If you experience "the worst abdominal pain ever" or develop nausea or vomiting, please contact the office immediatly for further recommendations for treatment. ° °ITCHING ° If you experience itching with your medications, try taking only a single pain pill, or even half a pain pill at a time.  You can also use Benadryl over the counter for itching or also to help with sleep.  ° °TED HOSE STOCKINGS °Wear the elastic stockings on both legs for three weeks following surgery during the day but you may remove then at night for sleeping. ° °MEDICATIONS °See your medication summary on the “After Visit Summary” that the nursing staff will review with you prior to discharge.  You may have some home medications which will be placed on hold until you complete the course of blood thinner medication.  It is important for you to complete the blood thinner medication as prescribed by your surgeon.  Continue your approved medications as instructed at time of  discharge. ° °PRECAUTIONS °If you experience chest pain or shortness of breath - call 911 immediately for transfer to the hospital emergency department.  °If you develop a fever greater that 101 F, purulent drainage from wound, increased redness or drainage from wound, foul odor from the wound/dressing, or calf pain - CONTACT YOUR SURGEON.   °                                                °FOLLOW-UP APPOINTMENTS °Make sure you keep all of your appointments after your operation with your surgeon and caregivers. You should call the office at the above phone number and make an appointment for approximately two weeks after the date of your surgery or on the date instructed by your surgeon outlined in the "After Visit Summary". ° ° °RANGE OF MOTION AND STRENGTHENING EXERCISES  °Rehabilitation of the knee is important following a knee injury or   an operation. After just a few days of immobilization, the muscles of the thigh which control the knee become weakened and shrink (atrophy). Knee exercises are designed to build up the tone and strength of the thigh muscles and to improve knee motion. Often times heat used for twenty to thirty minutes before working out will loosen up your tissues and help with improving the range of motion but do not use heat for the first two weeks following surgery. These exercises can be done on a training (exercise) mat, on the floor, on a table or on a bed. Use what ever works the best and is most comfortable for you Knee exercises include:  °Leg Lifts - While your knee is still immobilized in a splint or cast, you can do straight leg raises. Lift the leg to 60 degrees, hold for 3 sec, and slowly lower the leg. Repeat 10-20 times 2-3 times daily. Perform this exercise against resistance later as your knee gets better.  °Quad and Hamstring Sets - Tighten up the muscle on the front of the thigh (Quad) and hold for 5-10 sec. Repeat this 10-20 times hourly. Hamstring sets are done by pushing the  foot backward against an object and holding for 5-10 sec. Repeat as with quad sets.  °· Leg Slides: Lying on your back, slowly slide your foot toward your buttocks, bending your knee up off the floor (only go as far as is comfortable). Then slowly slide your foot back down until your leg is flat on the floor again. °· Angel Wings: Lying on your back spread your legs to the side as far apart as you can without causing discomfort.  °A rehabilitation program following serious knee injuries can speed recovery and prevent re-injury in the future due to weakened muscles. Contact your doctor or a physical therapist for more information on knee rehabilitation.  ° °IF YOU ARE TRANSFERRED TO A SKILLED REHAB FACILITY °If the patient is transferred to a skilled rehab facility following release from the hospital, a list of the current medications will be sent to the facility for the patient to continue.  When discharged from the skilled rehab facility, please have the facility set up the patient's Home Health Physical Therapy prior to being released. Also, the skilled facility will be responsible for providing the patient with their medications at time of release from the facility to include their pain medication, the muscle relaxants, and their blood thinner medication. If the patient is still at the rehab facility at time of the two week follow up appointment, the skilled rehab facility will also need to assist the patient in arranging follow up appointment in our office and any transportation needs. ° °MAKE SURE YOU:  °Understand these instructions.  °Get help right away if you are not doing well or get worse.  ° ° °Pick up stool softner and laxative for home use following surgery while on pain medications. °Do not submerge incision under water. °Please use good hand washing techniques while changing dressing each day. °May shower starting three days after surgery. °Please use a clean towel to pat the incision dry following  showers. °Continue to use ice for pain and swelling after surgery. °Do not use any lotions or creams on the incision until instructed by your surgeon. ° °Take Xarelto for two and a half more weeks following discharge from the hospital, then discontinue Xarelto. °Once the patient has completed the blood thinner regimen, then take a Baby 81 mg Aspirin daily for three   more weeks. ° °Information on my medicine - XARELTO® (Rivaroxaban) ° °This medication education was reviewed with me or my healthcare representative as part of my discharge preparation.  The pharmacist that spoke with me during my hospital stay was:  Glogovac,nikola, RPH ° °Why was Xarelto® prescribed for you? °Xarelto® was prescribed for you to reduce the risk of blood clots forming after orthopedic surgery. The medical term for these abnormal blood clots is venous thromboembolism (VTE). ° °What do you need to know about xarelto® ? °Take your Xarelto® ONCE DAILY at the same time every day. °You may take it either with or without food. ° °If you have difficulty swallowing the tablet whole, you may crush it and mix in applesauce just prior to taking your dose. ° °Take Xarelto® exactly as prescribed by your doctor and DO NOT stop taking Xarelto® without talking to the doctor who prescribed the medication.  Stopping without other VTE prevention medication to take the place of Xarelto® may increase your risk of developing a clot. ° °After discharge, you should have regular check-up appointments with your healthcare provider that is prescribing your Xarelto®.   ° °What do you do if you miss a dose? °If you miss a dose, take it as soon as you remember on the same day then continue your regularly scheduled once daily regimen the next day. Do not take two doses of Xarelto® on the same day.  ° °Important Safety Information °A possible side effect of Xarelto® is bleeding. You should call your healthcare provider right away if you experience any of the  following: °? Bleeding from an injury or your nose that does not stop. °? Unusual colored urine (red or dark brown) or unusual colored stools (red or black). °? Unusual bruising for unknown reasons. °? A serious fall or if you hit your head (even if there is no bleeding). ° °Some medicines may interact with Xarelto® and might increase your risk of bleeding while on Xarelto®. To help avoid this, consult your healthcare provider or pharmacist prior to using any new prescription or non-prescription medications, including herbals, vitamins, non-steroidal anti-inflammatory drugs (NSAIDs) and supplements. ° °This website has more information on Xarelto®: www.xarelto.com. ° ° ° °

## 2017-02-10 NOTE — Anesthesia Procedure Notes (Addendum)
Spinal  Patient location during procedure: OR Start time: 02/10/2017 11:39 AM End time: 02/10/2017 11:46 AM Reason for block: at surgeon's request Staffing Anesthesiologist: Montez Hageman Performed: anesthesiologist  Preanesthetic Checklist Completed: patient identified, site marked, surgical consent, pre-op evaluation, timeout performed, IV checked, risks and benefits discussed and monitors and equipment checked Spinal Block Patient position: sitting Prep: DuraPrep Patient monitoring: heart rate, continuous pulse ox and blood pressure Approach: right paramedian Location: L2-3 Injection technique: single-shot Needle Needle type: Pencan  Needle gauge: 24 G Needle length: 9 cm Assessment Sensory level: T6 Additional Notes Expiration date of kit checked and confirmed. Patient tolerated procedure well, without complications. X 2 attempt.  CRNA X 1 L2-L3 without success, Anesthesiologist X 1 same location with noted clear CSF return. Loss of motor and sensory on exam post injection.

## 2017-02-10 NOTE — H&P (View-Only) (Signed)
Heather Lambert DOB: 18-Mar-1956 Widowed / Language: English / Race: White Female Date of Admission:  02/10/2017 CC:  Left Knee Pain History of Present Illness The patient is a 61 year old female who comes in for a preoperative History and Physical. The patient is scheduled for a left total knee arthroplasty to be performed by Dr. Gus Rankin. Aluisio, MD at [facility] on 02/10/2017. The patient is a 61 year old female who presented with knee complaints. The patient reports left knee (worse than right) symptoms including: pain which began 4 year(s) ago without any known injury (onset after walking the dogs. She does state that she was diagnosed with osteoarthritis in 2006).The patient feels that the symptoms are worsening. The patient has the current diagnosis of knee osteoarthritis. Prior to being seen, the patient was previously evaluated by a colleague 3 year(s) ago. Previous work-up for this problem has included knee x-rays. Past treatment for this problem has included application of ice, application of heat, restricted activities and physical therapy. and include knee pain, stiffness, decreased range of motion, locking, difficulty bearing weight and difficulty ambulating. Current treatment includes application of heat, application of ice, restricted activity (she has been doing some stretching exercises, but can not really ride her bike anymore) and nonsteroidal anti-inflammatory drugs (Aleve, prn). Both knees have been very problematic for a long time now. Left knee has always been a little worse than the right. She has lost motion in her knees. It is getting harder to walk. It is affecting her back and her hips. She is at a stage now where she is having a very hard time functioning because of the knee pain and knee dysfunction. She has tried exercising which has not helped. She has had injections in the past which have not helped. She is ready to get the knees fixed at this time. They have been  treated conservatively in the past for the above stated problem and despite conservative measures, they continue to have progressive pain and severe functional limitations and dysfunction. They have failed non-operative management including home exercise, medications, and injections. It is felt that they would benefit from undergoing total joint replacement. Risks and benefits of the procedure have been discussed with the patient and they elect to proceed with surgery. There are no active contraindications to surgery such as ongoing infection or rapidly progressive neurological disease.  Problem List/Past Medical  Primary osteoarthritis of both knees (M17.0)  High blood pressure  Hypercholesterolemia  Kidney Stone  Osteoarthritis  Hemorrhoids  Urinary Tract Infection  Menopause   Allergies  Versed *HYPNOTICS/SEDATIVES/SLEEP DISORDER AGENTS*  Metal  States that she con only wear 14-karat gold jewelry, allergic to "silver, nickle, and gold-plated" Nickel Allergy  Family History Cancer  Father, Maternal Grandmother, Paternal Grandmother. Cerebrovascular Accident  Paternal Grandfather. Chronic Obstructive Lung Disease  Mother. Depression  Mother. Hypertension  Brother, Father, Mother. Osteoarthritis  Mother. Father  Deceased, Lung Cancer, Melanoma. age 60 Mother  Deceased, Hypertension. age 86 Siblings  Brother - blood pressure, cholesterol, sleep apnea  Social History  Children  0 Current drinker  11/22/2016: Currently drinks wine only occasionally per week Current work status  working full time Exercise  Moderate, 30 - 45 minutes, cycling, weight training, other type. Exercises daily; does other and gym / weights / stepper Living situation  live alone Marital status  widowed No history of drug/alcohol rehab  Not under pain contract  Number of flights of stairs before winded  4-5 Tobacco / smoke exposure  11/22/2016: no Tobacco use  Never smoker.  11/22/2016 Alcohol use  Occasional alcohol use, Drinks wine. Advance Directives  Living Will, Healthcare POA Post-Surgical Plans  Home with Friends  Medication History Aleve (220MG  Tablet, Oral as needed) Active. Lisinopril (30MG  Tablet, Oral) Active. Multivitamin (Oral) Active. Vitamin D (Oral) Specific strength unknown - Active. Rosuvastatin Calcium (5MG  Tablet, Oral) Active.   Past Surgical History Dilation and Curettage of Uterus  Tonsils/Adnoids  Lithotripsy and Cystoscopy  Retinal Laser Surgery    Review of Systems  General Not Present- Chills, Fever and Night Sweats. HEENT Not Present- Hearing problems and Vision problems. Respiratory Not Present- Shortness of breath. Cardiovascular Present- Hypertension. Gastrointestinal Not Present- Constipation, Diarrhea, Heartburn, Nausea and Vomiting. Female Genitourinary Not Present- Blood in Urine and Urgency. Musculoskeletal Present- Decreased Range of Motion, Joint Pain, Joint Stiffness, Morning Stiffness and Muscle Cramps.  Vitals Weight: 175 lb Height: 61.5in Weight was reported by patient. Height was reported by patient. Body Surface Area: 1.8 m Body Mass Index: 32.53 kg/m  Pulse: 76 (Regular)  BP: 148/88 (Sitting, Left Arm, Standard)   Physical Exam General Mental Status -Alert, cooperative and good historian. General Appearance-pleasant, Not in acute distress. Orientation-Oriented X3. Build & Nutrition-Well nourished and Well developed.  Head and Neck Head-normocephalic, atraumatic . Neck Global Assessment - supple, no bruit auscultated on the right, no bruit auscultated on the left.  Eye Vision-Wears corrective lenses. Pupil - Bilateral-Regular and Round. Motion - Bilateral-EOMI.  Chest and Lung Exam Auscultation Breath sounds - clear at anterior chest wall and clear at posterior chest wall. Adventitious sounds - No Adventitious  sounds.  Cardiovascular Auscultation Rhythm - Regular rate and rhythm. Heart Sounds - S1 WNL and S2 WNL. Murmurs & Other Heart Sounds - Auscultation of the heart reveals - No Murmurs.  Abdomen Palpation/Percussion Tenderness - Abdomen is non-tender to palpation. Rigidity (guarding) - Abdomen is soft. Auscultation Auscultation of the abdomen reveals - Bowel sounds normal.  Female Genitourinary Note: Not done, not pertinent to present illness   Musculoskeletal Note: On exam, she is alert and oriented, in no apparent distress. Both hips have normal range of motion without discomfort. Her left knee shows varus deformity, range is 15 to 80 with no instability. She is tender in medial and lateral. Right knee, no effusion. Range is 10 to 110. Marked crepitus on range of motion. Tender medial greater than lateral with no instability.  RADIOGRAPHS AP and lateral of both knees show that she has severe end-stage arthritis of both knees, medial and patellofemoral with lateral involvement also. Left knee is slightly worse than the right, but both are severely affected.  Assessment & Plan Primary osteoarthritis of both knees (M17.0)  Note:Surgical Plans: Left Total Knee Replacement  Disposition: Home  PCP: Dr. Duanne Guessewey - patient was seen preop for surgical clearance.  IV TXA  Anesthesia Issues: Questionable reaction to Versed  Patient was instructed on what medications to stop prior to surgery.  Please note that the patient was referred to an Allergist preop for metal testing prior to surgery. 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 She will need the Asculap prosthesis.  Signed electronically by Lauraine RinneAlexzandrew L Perkins, III PA-C

## 2017-02-10 NOTE — Anesthesia Postprocedure Evaluation (Signed)
Anesthesia Post Note  Patient: Deniece PortelaMelinda Aderman  Procedure(s) Performed: Procedure(s) (LRB): LEFT TOTAL KNEE ARTHROPLASTY (Left)     Patient location during evaluation: PACU Anesthesia Type: Spinal and Regional Level of consciousness: awake and alert Pain management: pain level controlled Vital Signs Assessment: post-procedure vital signs reviewed and stable Respiratory status: spontaneous breathing and respiratory function stable Cardiovascular status: blood pressure returned to baseline and stable Postop Assessment: no headache, no backache and spinal receding Anesthetic complications: no    Last Vitals:  Vitals:   02/10/17 1400 02/10/17 1418  BP:  117/65  Pulse:  (!) 58  Resp:  14  Temp: 36.4 C 36.4 C    Last Pain:  Vitals:   02/10/17 1418  TempSrc: Oral  PainSc: 0-No pain                 Phillips Groutarignan, Ezelle Surprenant

## 2017-02-10 NOTE — Interval H&P Note (Signed)
History and Physical Interval Note:  02/10/2017 9:31 AM  Heather Lambert  has presented today for surgery, with the diagnosis of Osteoarthritis Left Knee  The various methods of treatment have been discussed with the patient and family. After consideration of risks, benefits and other options for treatment, the patient has consented to  Procedure(s): LEFT TOTAL KNEE ARTHROPLASTY (Left) as a surgical intervention .  The patient's history has been reviewed, patient examined, no change in status, stable for surgery.  I have reviewed the patient's chart and labs.  Questions were answered to the patient's satisfaction.     Loanne DrillingALUISIO,Cale Decarolis V

## 2017-02-10 NOTE — Anesthesia Preprocedure Evaluation (Signed)
Anesthesia Evaluation  Patient identified by MRN, date of birth, ID band Patient awake    Reviewed: Allergy & Precautions, NPO status , Patient's Chart, lab work & pertinent test results  Airway Mallampati: II  TM Distance: >3 FB Neck ROM: Full    Dental no notable dental hx.    Pulmonary neg pulmonary ROS,    Pulmonary exam normal breath sounds clear to auscultation       Cardiovascular hypertension, Pt. on medications Normal cardiovascular exam Rhythm:Regular Rate:Normal     Neuro/Psych negative neurological ROS  negative psych ROS   GI/Hepatic negative GI ROS, Neg liver ROS,   Endo/Other  negative endocrine ROS  Renal/GU negative Renal ROS  negative genitourinary   Musculoskeletal negative musculoskeletal ROS (+)   Abdominal   Peds negative pediatric ROS (+)  Hematology negative hematology ROS (+)   Anesthesia Other Findings   Reproductive/Obstetrics negative OB ROS                             Anesthesia Physical Anesthesia Plan  ASA: II  Anesthesia Plan: Spinal   Post-op Pain Management: GA combined w/ Regional for post-op pain   Induction:   PONV Risk Score and Plan: 2 and Ondansetron and Dexamethasone  Airway Management Planned: Simple Face Mask  Additional Equipment:   Intra-op Plan:   Post-operative Plan:   Informed Consent: I have reviewed the patients History and Physical, chart, labs and discussed the procedure including the risks, benefits and alternatives for the proposed anesthesia with the patient or authorized representative who has indicated his/her understanding and acceptance.   Dental advisory given  Plan Discussed with:   Anesthesia Plan Comments:         Anesthesia Quick Evaluation

## 2017-02-11 LAB — BASIC METABOLIC PANEL
Anion gap: 6 (ref 5–15)
BUN: 12 mg/dL (ref 6–20)
CALCIUM: 8.4 mg/dL — AB (ref 8.9–10.3)
CO2: 25 mmol/L (ref 22–32)
CREATININE: 0.7 mg/dL (ref 0.44–1.00)
Chloride: 109 mmol/L (ref 101–111)
GFR calc Af Amer: >60 mL/min (ref >60)
GFR calc non Af Amer: >60 mL/min (ref >60)
GLUCOSE: 147 mg/dL — AB (ref 65–99)
Potassium: 4.5 mmol/L (ref 3.5–5.1)
Sodium: 140 mmol/L (ref 135–145)

## 2017-02-11 LAB — CBC
HEMATOCRIT: 36.5 % (ref 36.0–46.0)
Hemoglobin: 12.2 g/dL (ref 12.0–15.0)
MCH: 29.6 pg (ref 26.0–34.0)
MCHC: 33.4 g/dL (ref 30.0–36.0)
MCV: 88.6 fL (ref 78.0–100.0)
PLATELETS: 245 10*3/uL (ref 150–400)
RBC: 4.12 MIL/uL (ref 3.87–5.11)
RDW: 12.5 % (ref 11.5–15.5)
WBC: 11.4 10*3/uL — ABNORMAL HIGH (ref 4.0–10.5)

## 2017-02-11 MED ORDER — SODIUM CHLORIDE 0.9 % IV BOLUS (SEPSIS)
250.0000 mL | Freq: Once | INTRAVENOUS | Status: AC
  Administered 2017-02-11: 250 mL via INTRAVENOUS

## 2017-02-11 MED ORDER — OXYCODONE HCL 5 MG PO TABS: 5.0000 mg | ORAL_TABLET | ORAL | 0 refills | Status: DC | PRN

## 2017-02-11 MED ORDER — METHOCARBAMOL 500 MG PO TABS: 500.0000 mg | ORAL_TABLET | Freq: Four times a day (QID) | ORAL | 0 refills | Status: DC | PRN

## 2017-02-11 MED ORDER — RIVAROXABAN 10 MG PO TABS: 10.0000 mg | ORAL_TABLET | Freq: Every day | ORAL | 0 refills | Status: DC

## 2017-02-11 MED ORDER — TRAMADOL HCL 50 MG PO TABS: 50.0000 mg | ORAL_TABLET | Freq: Four times a day (QID) | ORAL | 0 refills | Status: DC | PRN

## 2017-02-11 NOTE — Evaluation (Signed)
Occupational Therapy Evaluation Patient Details Name: Heather PortelaMelinda Lambert MRN: [id number] DOB: 1955-12-10 Today's Date: 02/11/2017    History of Present Illness Pt is a 61 y/o F s/p L TKA.    Clinical Impression   This 61 y/o F presents with the above. At baseline Pt is independent with ADLs, iADLs (Pt drives and is self-employed), and functional mobility. Pt currently requires MinGuard assist for functional mobility and MinA for LB ADLs. Pt lives alone, has arranged for friends to stay after discharge to offer 24hr assist initially for ADL completion PRN. Education provided throughout on AE, DME and compensatory techniques for completing ADLs and functional mobility transfers. Questions answered throughout. Pt reports feeling comfortable completing ADLs after return home and with available assist from friends PRN. No further acute OT needs identified at this time. Will sign off.     Follow Up Recommendations  No OT follow up;Supervision/Assistance - 24 hour (initially )    Equipment Recommendations  3 in 1 bedside commode           Precautions / Restrictions Precautions Precautions: Fall Precaution Comments: reviewed knee precautions  Restrictions Weight Bearing Restrictions: No Other Position/Activity Restrictions: WBAT LLE       Mobility Bed Mobility Overal bed mobility: Needs Assistance Bed Mobility: Supine to Sit     Supine to sit: Min guard;HOB elevated     General bed mobility comments: min guard for safety   Transfers Overall transfer level: Needs assistance Equipment used: Rolling walker (2 wheeled) Transfers: Sit to/from Stand Sit to Stand: Min guard         General transfer comment: close guard for safety; Pt demonstrates proper UE placement during transfer, verbal cues for LE placement                                                ADL either performed or assessed with clinical judgement   ADL Overall ADL's : Needs  assistance/impaired Eating/Feeding: Independent;Sitting   Grooming: Oral care;Supervision/safety;Standing   Upper Body Bathing: Sitting;Supervision/ safety   Lower Body Bathing: Min guard;Sit to/from stand   Upper Body Dressing : Set up;Sitting   Lower Body Dressing: Minimal assistance;Sit to/from stand Lower Body Dressing Details (indicate cue type and reason): educated Pt on use of AE and compensatory techniques for LB dressing Toilet Transfer: Min guard;Ambulation;RW;BSC Toilet Transfer Details (indicate cue type and reason): BSC over toilet  Toileting- Clothing Manipulation and Hygiene: Min guard;Sit to/from stand   Tub/ Engineer, structuralhower Transfer: Walk-in shower;Min Educational psychologistguard;Rolling walker Tub/Shower Transfer Details (indicate cue type and reason): min verbal cues for sequence/technique  Functional mobility during ADLs: Min guard;Rolling walker General ADL Comments: Educated Pt on AE and compensatory techniques for completing ADLs, Handout provided on walk-in shower transfer and LB dressing compensatory strategies      Vision: Baseline Vision/History: Wears glasses                  Pertinent Vitals/Pain Pain Assessment: 0-10 Pain Score: 2  Pain Location: L knee  Pain Descriptors / Indicators: Sore Pain Intervention(s): Limited activity within patient's tolerance;Monitored during session;Premedicated before session;Ice applied     Hand Dominance     Extremity/Trunk Assessment Upper Extremity Assessment Upper Extremity Assessment: Overall WFL for tasks assessed   Lower Extremity Assessment Lower Extremity Assessment: Defer to PT evaluation       Communication Communication Communication: No  difficulties   Cognition Arousal/Alertness: Awake/alert Behavior During Therapy: WFL for tasks assessed/performed Overall Cognitive Status: Within Functional Limits for tasks assessed                                 General Comments: Pt is very fast moving (at baseline,  fast moving personality)   General Comments                  Home Living Family/patient expects to be discharged to:: Private residence Living Arrangements: Alone Available Help at Discharge: Friend(s);Available 24 hours/day (Pt reports friends will be staying with her for the first 5-6 days) Type of Home: House Home Access: Stairs to enter [employer] of Steps: 2   Home Layout: Two level;Able to live on main level with bedroom/bathroom (basement (home office)) Alternate Level Stairs-Number of Steps: flight    Bathroom Shower/Tub: Walk-in shower   Bathroom Toilet: Handicapped height     Home Equipment: Environmental consultant - 2 wheels;Shower seat          Prior Functioning/Environment Level of Independence: Independent        Comments: 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), drives        OT Problem List: Decreased strength;Impaired balance (sitting and/or standing);Decreased activity tolerance;Decreased knowledge of use of DME or AE;Decreased knowledge of precautions      OT Treatment/Interventions:      OT Goals(Current goals can be found in the care plan section) Acute Rehab OT Goals Patient Stated Goal: return to independence  OT Goal Formulation: With patient                                 AM-PAC PT "6 Clicks" Daily Activity     Outcome Measure Help from another person eating meals?: None Help from another person taking care of personal grooming?: A Little Help from another person toileting, which includes using toliet, bedpan, or urinal?: A Little Help from another person bathing (including washing, rinsing, drying)?: A Little Help from another person to put on and taking off regular upper body clothing?: None Help from another person to put on and taking off regular lower body clothing?: A Little 6 Click Score: 20   End of Session Equipment Utilized During Treatment: Gait belt;Rolling walker Nurse Communication: Mobility status  Activity  Tolerance: Patient tolerated treatment well Patient left: in chair;with call bell/phone within reach;with chair alarm set  OT Visit Diagnosis: Unsteadiness on feet (R26.81);Muscle weakness (generalized) (M62.81)                Time: 1610-9604 OT Time Calculation (min): 40 min Charges:  OT General Charges $OT Visit: 1 Procedure OT Evaluation $OT Eval Low Complexity: 1 Procedure OT Treatments $Self Care/Home Management : 8-22 mins G-Codes:     Marcy Siren, OT Pager [phone] 02/11/2017   Heather Lambert 02/11/2017, 11:30 AM

## 2017-02-11 NOTE — Discharge Summary (Signed)
Physician Discharge Summary   Patient ID: Heather Lambert MRN: [id number] DOB/AGE: November 18, 1955 61 y.o.  Admit date: 02/10/2017 Discharge date: 02/12/2017  Primary Diagnosis:  Osteoarthritis  Left knee(s) Admission Diagnoses:  Past Medical History:  Diagnosis Date  . Allergy 1995   enviromental food, grasses, smoke  . Arthritis    knees  . Complication of anesthesia    versed not reacting well per M.D.  . GERD (gastroesophageal reflux disease)    occasional  . Heart murmur    hx of  ??  . History of kidney stones   . Hyperlipidemia   . Hypertension   . Pneumonia    walking pneumonia   Discharge Diagnoses:   Principal Problem:   OA (osteoarthritis) of knee  Estimated body mass index is 32.5 kg/m as calculated from the following:   Height as of this encounter: '5\' 1"'$  (1.549 m).   Weight as of this encounter: 78 kg (172 lb).  Procedure:  Procedure(s) (LRB): LEFT TOTAL KNEE ARTHROPLASTY (Left)   Consults: None  HPI: [name] is a 61 y.o. year old female with end stage OA of her left knee with progressively worsening pain and dysfunction. She has constant pain, with activity and at rest and significant functional deficits with difficulties even with ADLs. She has had extensive non-op management including analgesics, injections of cortisone and viscosupplements, and home exercise program, but remains in significant pain with significant dysfunction. Radiographs show bone on bone arthritis all 3 compartments. She presents now for left Total Knee Arthroplasty.   Laboratory Data: Admission on 02/10/2017  Component Date Value Ref Range Status  . WBC 02/11/2017 11.4* 4.0 - 10.5 K/uL Final  . RBC 02/11/2017 4.12  3.87 - 5.11 MIL/uL Final  . Hemoglobin 02/11/2017 12.2  12.0 - 15.0 g/dL Final  . HCT 02/11/2017 36.5  36.0 - 46.0 % Final  . MCV 02/11/2017 88.6  78.0 - 100.0 fL Final  . MCH 02/11/2017 29.6  26.0 - 34.0 pg Final  . MCHC 02/11/2017 33.4  30.0 - 36.0 g/dL Final  .  RDW 02/11/2017 12.5  11.5 - 15.5 % Final  . Platelets 02/11/2017 245  150 - 400 K/uL Final  . Sodium 02/11/2017 140  135 - 145 mmol/L Final  . Potassium 02/11/2017 4.5  3.5 - 5.1 mmol/L Final  . Chloride 02/11/2017 109  101 - 111 mmol/L Final  . CO2 02/11/2017 25  22 - 32 mmol/L Final  . Glucose, Bld 02/11/2017 147* 65 - 99 mg/dL Final  . BUN 02/11/2017 12  6 - 20 mg/dL Final  . Creatinine, Ser 02/11/2017 0.70  0.44 - 1.00 mg/dL Final  . Calcium 02/11/2017 8.4* 8.9 - 10.3 mg/dL Final  . GFR calc non Af Amer 02/11/2017 >60  >60 mL/min Final  . GFR calc Af Amer 02/11/2017 >60  >60 mL/min Final   Comment: (NOTE) The eGFR has been calculated using the CKD EPI equation. This calculation has not been validated in all clinical situations. eGFR's persistently <60 mL/min signify possible Chronic Kidney Disease.   Georgiann Hahn gap 02/11/2017 6  5 - 15 Final  Hospital Outpatient Visit on 02/04/2017  Component Date Value Ref Range Status  . aPTT 02/04/2017 27  24 - 36 seconds Final  . WBC 02/04/2017 5.8  4.0 - 10.5 K/uL Final  . RBC 02/04/2017 5.10  3.87 - 5.11 MIL/uL Final  . Hemoglobin 02/04/2017 15.4* 12.0 - 15.0 g/dL Final  . HCT 02/04/2017 44.2  36.0 - 46.0 % Final  .  MCV 02/04/2017 86.7  78.0 - 100.0 fL Final  . MCH 02/04/2017 30.2  26.0 - 34.0 pg Final  . MCHC 02/04/2017 34.8  30.0 - 36.0 g/dL Final  . RDW 02/04/2017 12.5  11.5 - 15.5 % Final  . Platelets 02/04/2017 282  150 - 400 K/uL Final  . Sodium 02/04/2017 141  135 - 145 mmol/L Final  . Potassium 02/04/2017 4.1  3.5 - 5.1 mmol/L Final  . Chloride 02/04/2017 109  101 - 111 mmol/L Final  . CO2 02/04/2017 23  22 - 32 mmol/L Final  . Glucose, Bld 02/04/2017 89  65 - 99 mg/dL Final  . BUN 02/04/2017 17  6 - 20 mg/dL Final  . Creatinine, Ser 02/04/2017 0.64  0.44 - 1.00 mg/dL Final  . Calcium 02/04/2017 9.5  8.9 - 10.3 mg/dL Final  . Total Protein 02/04/2017 7.4  6.5 - 8.1 g/dL Final  . Albumin 02/04/2017 4.7  3.5 - 5.0 g/dL Final  .  AST 02/04/2017 22  15 - 41 U/L Final  . ALT 02/04/2017 20  14 - 54 U/L Final  . Alkaline Phosphatase 02/04/2017 71  38 - 126 U/L Final  . Total Bilirubin 02/04/2017 0.7  0.3 - 1.2 mg/dL Final  . GFR calc non Af Amer 02/04/2017 >60  >60 mL/min Final  . GFR calc Af Amer 02/04/2017 >60  >60 mL/min Final   Comment: (NOTE) The eGFR has been calculated using the CKD EPI equation. This calculation has not been validated in all clinical situations. eGFR's persistently <60 mL/min signify possible Chronic Kidney Disease.   . Anion gap 02/04/2017 9  5 - 15 Final  . Prothrombin Time 02/04/2017 13.4  11.4 - 15.2 seconds Final  . INR 02/04/2017 1.02   Final  . ABO/RH(D) 02/04/2017 O POS   Final  . Antibody Screen 02/04/2017 NEG   Final  . Sample Expiration 02/04/2017 02/13/2017   Final  . Extend sample reason 02/04/2017 NO TRANSFUSIONS OR PREGNANCY IN THE PAST 3 MONTHS   Final  . MRSA, PCR 02/04/2017 NEGATIVE  NEGATIVE Final  . Staphylococcus aureus 02/04/2017 POSITIVE* NEGATIVE Final   Comment:        The Xpert SA Assay (FDA approved for NASAL specimens in patients over 61 years of age), is one component of a comprehensive surveillance program.  Test performance has been validated by [facility] for patients greater than or equal to 64 year old. It is not intended to diagnose infection nor to guide or monitor treatment.   . ABO/RH(D) 02/04/2017 O POS   Final     X-Rays:No results found.  EKG: Orders placed or performed during the hospital encounter of 02/04/17  . EKG 12 lead  . EKG 12 lead     Hospital Course: [name] is a 61 y.o. who was admitted to [facility] They were brought to the operating room on 02/10/2017 and underwent Procedure(s): LEFT TOTAL KNEE ARTHROPLASTY.  Patient tolerated the procedure well and was later transferred to the recovery room and then to the orthopaedic floor for postoperative care.  They were given PO and IV analgesics for pain control  following their surgery.  They were given 24 hours of postoperative antibiotics of  Anti-infectives    Start     Dose/Rate Route Frequency Ordered Stop   02/10/17 1800  ceFAZolin (ANCEF) IVPB 2g/100 mL premix     2 g 200 mL/hr over 30 Minutes Intravenous Every 6 hours 02/10/17 1432 02/11/17 0035  02/10/17 0942  ceFAZolin (ANCEF) 2-4 GM/100ML-% IVPB    Comments:  Waldron Session   : cabinet override      02/10/17 1308 02/10/17 1152   02/10/17 0940  ceFAZolin (ANCEF) IVPB 2g/100 mL premix     2 g 200 mL/hr over 30 Minutes Intravenous On call to O.R. 02/10/17 0940 02/10/17 1222     and started on DVT prophylaxis in the form of Xarelto.   PT and OT were ordered for total joint protocol.  Discharge planning consulted to help with postop disposition and equipment needs.  Patient had a decent night on the evening of surgery.  They started to get up OOB with therapy on day one. Hemovac drain was pulled without difficulty.  Continued to work with therapy into day two.  Dressing was changed on day two and the incision was healing well. Patient was seen in rounds and was ready to go home.   Diet - Cardiac diet Follow up - in 2 weeks Activity - WBAT Disposition - Home Condition Upon Discharge - Good D/C Meds - See DC Summary DVT Prophylaxis - Xarelto  Discharge Instructions    Call MD / Call 911    Complete by:  As directed    If you experience chest pain or shortness of breath, CALL 911 and be transported to the hospital emergency room.  If you develope a fever above 101 F, pus (white drainage) or increased drainage or redness at the wound, or calf pain, call your surgeon's office.   Change dressing    Complete by:  As directed    Change dressing daily with sterile 4 x 4 inch gauze dressing and apply TED hose. Do not submerge the incision under water.   Constipation Prevention    Complete by:  As directed    Drink plenty of fluids.  Prune juice may be helpful.  You may use a stool softener,  such as Colace (over the counter) 100 mg twice a day.  Use MiraLax (over the counter) for constipation as needed.   Diet - low sodium heart healthy    Complete by:  As directed    Discharge instructions    Complete by:  As directed    Take Xarelto for two and a half more weeks, then discontinue Xarelto. Once the patient has completed the blood thinner regimen, then take a Baby 81 mg Aspirin daily for three more weeks.   Pick up stool softner and laxative for home use following surgery while on pain medications. Do not submerge incision under water. Please use good hand washing techniques while changing dressing each day. May shower starting three days after surgery. Please use a clean towel to pat the incision dry following showers. Continue to use ice for pain and swelling after surgery. Do not use any lotions or creams on the incision until instructed by your surgeon.  Wear both TED hose on both legs during the day every day for three weeks, but may remove the TED hose at night at home.  Postoperative Constipation Protocol  Constipation - defined medically as fewer than three stools per week and severe constipation as less than one stool per week.  One of the most common issues patients have following surgery is constipation.  Even if you have a regular bowel pattern at home, your normal regimen is likely to be disrupted due to multiple reasons following surgery.  Combination of anesthesia, postoperative narcotics, change in appetite and fluid intake all can affect your  bowels.  In order to avoid complications following surgery, here are some recommendations in order to help you during your recovery period.  Colace (docusate) - Pick up an over-the-counter form of Colace or another stool softener and take twice a day as long as you are requiring postoperative pain medications.  Take with a full glass of water daily.  If you experience loose stools or diarrhea, hold the colace until you  stool forms back up.  If your symptoms do not get better within 1 week or if they get worse, check with your doctor.  Dulcolax (bisacodyl) - Pick up over-the-counter and take as directed by the product packaging as needed to assist with the movement of your bowels.  Take with a full glass of water.  Use this product as needed if not relieved by Colace only.   MiraLax (polyethylene glycol) - Pick up over-the-counter to have on hand.  MiraLax is a solution that will increase the amount of water in your bowels to assist with bowel movements.  Take as directed and can mix with a glass of water, juice, soda, coffee, or tea.  Take if you go more than two days without a movement. Do not use MiraLax more than once per day. Call your doctor if you are still constipated or irregular after using this medication for 7 days in a row.  If you continue to have problems with postoperative constipation, please contact the office for further assistance and recommendations.  If you experience "the worst abdominal pain ever" or develop nausea or vomiting, please contact the office immediatly for further recommendations for treatment.   Do not put a pillow under the knee. Place it under the heel.    Complete by:  As directed    Do not sit on low chairs, stoools or toilet seats, as it may be difficult to get up from low surfaces    Complete by:  As directed    Driving restrictions    Complete by:  As directed    No driving until released by the physician.   Increase activity slowly as tolerated    Complete by:  As directed    Lifting restrictions    Complete by:  As directed    No lifting until released by the physician.   Patient may shower    Complete by:  As directed    You may shower without a dressing once there is no drainage.  Do not wash over the wound.  If drainage remains, do not shower until drainage stops.   TED hose    Complete by:  As directed    Use stockings (TED hose) for 3 weeks on both leg(s).   You may remove them at night for sleeping.   Weight bearing as tolerated    Complete by:  As directed    Laterality:  left   Extremity:  Lower     Allergies as of 02/11/2017      Reactions   Midazolam Other (See Comments)   (Versed) Reaction during lithotripsy   Nickel Swelling      Medication List    STOP taking these medications   ALEVE 220 MG Caps Generic drug:  Naproxen Sodium   Glycerin-Hypromellose-PEG 400 0.2-0.2-1 % Soln   multivitamin with minerals tablet   Vitamin D-3 5000 units Tabs     TAKE these medications   lisinopril 30 MG tablet Commonly known as:  PRINIVIL,ZESTRIL Take 60 mg by mouth at bedtime.   methocarbamol  500 MG tablet Commonly known as:  ROBAXIN Take 1 tablet (500 mg total) by mouth every 6 (six) hours as needed for muscle spasms.   neomycin-polymyxin-dexameth 0.1 % Oint Commonly known as:  MAXITROL Place 1 application into both eyes daily as needed (for inflammation of eyelid).   oxyCODONE 5 MG immediate release tablet Commonly known as:  Oxy IR/ROXICODONE Take 1-2 tablets (5-10 mg total) by mouth every 4 (four) hours as needed for moderate pain or severe pain.   rivaroxaban 10 MG Tabs tablet Commonly known as:  XARELTO Take 1 tablet (10 mg total) by mouth daily with breakfast. Take Xarelto for two and a half more weeks following discharge from the hospital, then discontinue Xarelto. Once the patient has completed the blood thinner regimen, then take a Baby 81 mg Aspirin daily for three more weeks. Start taking on:  02/12/2017   rosuvastatin 5 MG tablet Commonly known as:  CRESTOR Take 5 mg by mouth at bedtime.   traMADol 50 MG tablet Commonly known as:  ULTRAM Take 1-2 tablets (50-100 mg total) by mouth every 6 (six) hours as needed for moderate pain.      Follow-up Information    Gaynelle Arabian, MD. Schedule an appointment as soon as possible for a visit on 02/25/2017.   Specialty:  Orthopedic Surgery Contact information: [address] [place] [address] [phone]           Signed: Arlee Muslim, PA-C Orthopaedic Surgery 02/11/2017, 10:37 PM

## 2017-02-11 NOTE — Progress Notes (Signed)
Physical Therapy Treatment Patient Details Name: Heather PortelaMelinda Lambert MRN: [id number] DOB: 11/14/1955 Today's Date: 02/11/2017    History of Present Illness Pt is a 61 y/o F s/p L TKA.     PT Comments    The patient tolerates max stretching of the left knee to 80*. Continues to progress well.    Follow Up Recommendations  DC plan and follow up therapy as arranged by surgeon;Outpatient PT     Equipment Recommendations  None recommended by PT    Recommendations for Other Services       Precautions / Restrictions Precautions Precautions: Fall Precaution Comments: did not need KI Required Braces or Orthoses: Knee Immobilizer - Left Knee Immobilizer - Left: Discontinue once straight leg raise with < 10 degree lag Restrictions Weight Bearing Restrictions: No Other Position/Activity Restrictions: WBAT LLE     Mobility  Bed Mobility Overal bed mobility: Needs Assistance Bed Mobility: Sit to Supine     Supine to sit: Supervision Sit to supine: Supervision   General bed mobility comments: , manages the left leg  Transfers Overall transfer level: Needs assistance Equipment used: Rolling walker (2 wheeled) Transfers: Sit to/from Stand Sit to Stand: Min guard         General transfer comment: close guard for safety; Pt demonstrates proper UE placement during transfer, verbal cues for LE placement   Ambulation/Gait Ambulation/Gait assistance: Supervision Ambulation Distance (Feet): 200 Feet Assistive device: Rolling walker (2 wheeled) Gait Pattern/deviations: Step-through pattern     General Gait Details: cues for sequence and safety, slow pace down.   Stairs            Wheelchair Mobility    Modified Rankin (Stroke Patients Only)       Balance                                            Cognition Arousal/Alertness: Awake/alert Behavior During Therapy: WFL for tasks assessed/performed Overall Cognitive Status: Within Functional Limits  for tasks assessed                                 General Comments: Pt is very fast moving (at baseline, fast moving personality)      Exercises Total Joint Exercises Ankle Circles/Pumps: AROM;Both;10 reps;Supine Quad Sets: AROM;Both;10 reps;Supine Towel Squeeze: AROM;Left;10 reps;Supine Short Arc Quad: AROM;Left;10 reps;Supine Heel Slides: AAROM;Left;10 reps;Supine Hip ABduction/ADduction: AAROM;Left;10 reps;Supine Straight Leg Raises: AROM;Left;10 reps;Supine Long Arc Quad: AROM;Left;10 reps Knee Flexion: AROM;Left;10 reps Goniometric ROM: 10-80 left knee flexion    General Comments        Pertinent Vitals/Pain Pain Assessment: 0-10 Pain Score: 3  Faces Pain Scale: Hurts a little bit Pain Location: L knee  Pain Descriptors / Indicators: Sore;Tightness Pain Intervention(s): Monitored during session;Premedicated before session;Ice applied    Home Living Family/patient expects to be discharged to:: Private residence Living Arrangements: Alone Available Help at Discharge: Friend(s);Available 24 hours/day Type of Home: House Home Access: Stairs to enter Entrance Stairs-Rails: Left Home Layout: Two level;Able to live on main level with bedroom/bathroom Home Equipment: Dan HumphreysWalker - 2 wheels;Shower seat      Prior Function Level of Independence: Independent      Comments: 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), drives   PT Goals (current goals can now be found in the care plan section) Acute Rehab PT Goals  Patient Stated Goal: return to independence  PT Goal Formulation: With patient Time For Goal Achievement: 02/14/17 Potential to Achieve Goals: Good Progress towards PT goals: Progressing toward goals    Frequency    7X/week      PT Plan Current plan remains appropriate    Co-evaluation              AM-PAC PT "6 Clicks" Daily Activity  Outcome Measure  Difficulty turning over in bed (including adjusting bedclothes, sheets and blankets)?:  None Difficulty moving from lying on back to sitting on the side of the bed? : None Difficulty sitting down on and standing up from a chair with arms (e.g., wheelchair, bedside commode, etc,.)?: Total Help needed moving to and from a bed to chair (including a wheelchair)?: Total Help needed walking in hospital room?: Total Help needed climbing 3-5 steps with a railing? : Total 6 Click Score: 12    End of Session   Activity Tolerance: Patient tolerated treatment well Patient left: in bed;with call bell/phone within reach Nurse Communication: Mobility status PT Visit Diagnosis: Difficulty in walking, not elsewhere classified (R26.2);Pain Pain - Right/Left: Left Pain - part of body: Knee     Time: 4401-0272 PT Time Calculation (min) (ACUTE ONLY): 46 min  Charges:  $Gait Training: 8-22 mins $Therapeutic Exercise: 8-22 mins $Self Care/Home Management: 8-22                    G CodesBlanchard Lambert PT [phone]    Heather Lambert 02/11/2017, 3:18 PM

## 2017-02-11 NOTE — Progress Notes (Signed)
Discharge planning, no HH needs identified. Plan for OP PT, borrowing a RW has 3n1. [phone]

## 2017-02-11 NOTE — Evaluation (Signed)
Physical Therapy Evaluation Patient Details Name: Heather PortelaMelinda Lambert MRN: [id number] DOB: 1956/04/04 Today's Date: 02/11/2017   History of Present Illness  Pt is a 61 y/o F s/p L TKA.   Clinical Impression  THe patient reports that her lnee was really limited in flexion PTA. Patient tolerates ROM well. Plans OPPT. Pt admitted with above diagnosis. Pt currently with functional limitations due to the deficits listed below (see PT Problem List).  Pt will benefit from skilled PT to increase their independence and safety with mobility to allow discharge to the venue listed below.       Follow Up Recommendations DC plan and follow up therapy as arranged by surgeon;Outpatient PT    Equipment Recommendations  None recommended by PT    Recommendations for Other Services       Precautions / Restrictions Precautions Precautions: Fall Precaution Comments: reviewed knee precautions  Required Braces or Orthoses: Knee Immobilizer - Left Knee Immobilizer - Left: Discontinue once straight leg raise with < 10 degree lag Restrictions Weight Bearing Restrictions: No Other Position/Activity Restrictions: WBAT LLE       Mobility  Bed Mobility Overal bed mobility: Needs Assistance Bed Mobility: Sit to Supine     Supine to sit: Min guard;HOB elevated Sit to supine: Min guard   General bed mobility comments: min guard for safety , manages the left leg  Transfers Overall transfer level: Needs assistance Equipment used: Rolling walker (2 wheeled) Transfers: Sit to/from Stand Sit to Stand: Min guard         General transfer comment: close guard for safety; Pt demonstrates proper UE placement during transfer, verbal cues for LE placement   Ambulation/Gait Ambulation/Gait assistance: Min assist Ambulation Distance (Feet): 150 Feet Assistive device: Rolling walker (2 wheeled) Gait Pattern/deviations: Step-to pattern;Step-through pattern     General Gait Details: cues for sequence and  safety, slow pace down.  Stairs            Wheelchair Mobility    Modified Rankin (Stroke Patients Only)       Balance                                             Pertinent Vitals/Pain Pain Assessment: 0-10 Pain Score: 3  Pain Location: L knee  Pain Descriptors / Indicators: Sore;Tightness Pain Intervention(s): Monitored during session;Premedicated before session;Ice applied;Repositioned;Patient requesting pain meds-RN notified    Home Living Family/patient expects to be discharged to:: Private residence Living Arrangements: Alone Available Help at Discharge: Friend(s);Available 24 hours/day Type of Home: House Home Access: Stairs to enter Entrance Stairs-Rails: Left Entrance Stairs-Number of Steps: 2 Home Layout: Two level;Able to live on main level with bedroom/bathroom Home Equipment: Dan HumphreysWalker - 2 wheels;Shower seat      Prior Function Level of Independence: Independent         Comments: 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), drives     Higher education careers adviserHand Dominance        Extremity/Trunk Assessment   Upper Extremity Assessment Upper Extremity Assessment: Defer to OT evaluation    Lower Extremity Assessment Lower Extremity Assessment: LLE deficits/detail LLE Deficits / Details: 10-60 knee flexion    Cervical / Trunk Assessment Cervical / Trunk Assessment: Normal  Communication   Communication: No difficulties  Cognition Arousal/Alertness: Awake/alert Behavior During Therapy: WFL for tasks assessed/performed Overall Cognitive Status: Within Functional Limits for tasks assessed  General Comments: Pt is very fast moving (at baseline, fast moving personality)      General Comments      Exercises Total Joint Exercises Ankle Circles/Pumps: AROM;Both;10 reps;Supine Quad Sets: AROM;Both;10 reps;Supine Towel Squeeze: AROM;Left;10 reps;Supine Short Arc Quad: AROM;Left;10 reps;Supine Heel Slides:  AAROM;Left;10 reps;Supine Hip ABduction/ADduction: AAROM;Left;10 reps;Supine Straight Leg Raises: AROM;Left;10 reps;Supine   Assessment/Plan    PT Assessment Patient needs continued PT services  PT Problem List Decreased strength;Decreased range of motion;Decreased activity tolerance;Decreased balance;Decreased mobility;Decreased knowledge of precautions;Decreased safety awareness;Decreased knowledge of use of DME;Pain       PT Treatment Interventions DME instruction;Gait training;Stair training;Functional mobility training;Therapeutic exercise;Therapeutic activities;Patient/family education    PT Goals (Current goals can be found in the Care Plan section)  Acute Rehab PT Goals Patient Stated Goal: return to independence  PT Goal Formulation: With patient Time For Goal Achievement: 02/14/17 Potential to Achieve Goals: Good    Frequency 7X/week   Barriers to discharge        Co-evaluation               AM-PAC PT "6 Clicks" Daily Activity  Outcome Measure Difficulty turning over in bed (including adjusting bedclothes, sheets and blankets)?: None Difficulty moving from lying on back to sitting on the side of the bed? : None Difficulty sitting down on and standing up from a chair with arms (e.g., wheelchair, bedside commode, etc,.)?: Total Help needed moving to and from a bed to chair (including a wheelchair)?: Total Help needed walking in hospital room?: Total Help needed climbing 3-5 steps with a railing? : Total 6 Click Score: 12    End of Session   Activity Tolerance: Patient tolerated treatment well Patient left: in bed;with call bell/phone within reach Nurse Communication: Mobility status PT Visit Diagnosis: Difficulty in walking, not elsewhere classified (R26.2);Pain Pain - Right/Left: Left Pain - part of body: Knee    Time: 1100-1145 PT Time Calculation (min) (ACUTE ONLY): 45 min   Charges:   PT Evaluation $PT Eval Low Complexity: 1 Procedure PT  Treatments $Gait Training: 8-22 mins $Therapeutic Exercise: 8-22 mins   PT G CodesBlanchard Lambert PT [phone]   Heather Lambert 02/11/2017, 3:08 PM

## 2017-02-11 NOTE — Progress Notes (Signed)
   Subjective: 1 Day Post-Op Procedure(s) (LRB): LEFT TOTAL KNEE ARTHROPLASTY (Left) Patient reports pain as mild.   Patient seen in rounds with Dr. Lequita HaltAluisio. Patient is well, and has had no acute complaints or problems We will start therapy today.  Plan is to go Home after hospital stay.  Objective: Vital signs in last 24 hours: Temp:  [97.4 F (36.3 C)-98.7 F (37.1 C)] 97.4 F (36.3 C) (07/10 0535) Pulse Rate:  [58-90] 64 (07/10 0535) Resp:  [10-18] 14 (07/10 0535) BP: (98-142)/(57-90) 110/71 (07/10 0535) SpO2:  [94 %-100 %] 100 % (07/10 0535) Weight:  [78 kg (172 lb)] 78 kg (172 lb) (07/09 1418)  Intake/Output from previous day:  Intake/Output Summary (Last 24 hours) at 02/11/17 0802 Last data filed at 02/11/17 0600  Gross per 24 hour  Intake          4621.25 ml  Output             2800 ml  Net          1821.25 ml    Intake/Output this shift: No intake/output data recorded.  Labs:  Recent Labs  02/11/17 0533  HGB 12.2    Recent Labs  02/11/17 0533  WBC 11.4*  RBC 4.12  HCT 36.5  PLT 245    Recent Labs  02/11/17 0533  NA 140  K 4.5  CL 109  CO2 25  BUN 12  CREATININE 0.70  GLUCOSE 147*  CALCIUM 8.4*   No results for input(s): LABPT, INR in the last 72 hours.  EXAM General - Patient is Alert, Appropriate and Oriented Extremity - Neurovascular intact Sensation intact distally Dressing - dressing C/D/I Motor Function - intact, moving foot and toes well on exam.  Hemovac pulled without difficulty.  Past Medical History:  Diagnosis Date  . Allergy 1995   enviromental food, grasses, smoke  . Arthritis    knees  . Complication of anesthesia    versed not reacting well per M.D.  . GERD (gastroesophageal reflux disease)    occasional  . Heart murmur    hx of  ??  . History of kidney stones   . Hyperlipidemia   . Hypertension   . Pneumonia    walking pneumonia    Assessment/Plan: 1 Day Post-Op Procedure(s) (LRB): LEFT TOTAL KNEE  ARTHROPLASTY (Left) Principal Problem:   OA (osteoarthritis) of knee  Estimated body mass index is 32.5 kg/m as calculated from the following:   Height as of this encounter: 5\' 1"  (1.549 m).   Weight as of this encounter: 78 kg (172 lb). Advance diet Up with therapy Plan for discharge tomorrow Discharge home - straight to outpatient therapy  DVT Prophylaxis - Xarelto Weight-Bearing as tolerated to left leg D/C O2 and Pulse OX and try on Room Air  Heather Peacerew Yeny Schmoll, PA-C Orthopaedic Surgery 02/11/2017, 8:02 AM

## 2017-02-12 LAB — CBC
HEMATOCRIT: 35.2 % — AB (ref 36.0–46.0)
HEMOGLOBIN: 12 g/dL (ref 12.0–15.0)
MCH: 29.9 pg (ref 26.0–34.0)
MCHC: 34.1 g/dL (ref 30.0–36.0)
MCV: 87.8 fL (ref 78.0–100.0)
Platelets: 242 10*3/uL (ref 150–400)
RBC: 4.01 MIL/uL (ref 3.87–5.11)
RDW: 12.8 % (ref 11.5–15.5)
WBC: 13.2 10*3/uL — ABNORMAL HIGH (ref 4.0–10.5)

## 2017-02-12 LAB — BASIC METABOLIC PANEL
Anion gap: 9 (ref 5–15)
BUN: 12 mg/dL (ref 6–20)
CHLORIDE: 106 mmol/L (ref 101–111)
CO2: 27 mmol/L (ref 22–32)
CREATININE: 0.56 mg/dL (ref 0.44–1.00)
Calcium: 8.9 mg/dL (ref 8.9–10.3)
GFR calc Af Amer: >60 mL/min (ref >60)
GFR calc non Af Amer: >60 mL/min (ref >60)
Glucose, Bld: 124 mg/dL — ABNORMAL HIGH (ref 65–99)
Potassium: 4.2 mmol/L (ref 3.5–5.1)
Sodium: 142 mmol/L (ref 135–145)

## 2017-02-12 NOTE — Progress Notes (Signed)
Patient states RW that she borrowed will work but now requesting 3n1. Contacted [facility] to deliver to her room. [phone]

## 2017-02-12 NOTE — Progress Notes (Signed)
   Subjective: 2 Days Post-Op Procedure(s) (LRB): LEFT TOTAL KNEE ARTHROPLASTY (Left) Patient reports pain as mild.   Patient seen in rounds for Dr. Lequita HaltAluisio. Patient is well, but has had some minor complaints of pain in the knee, requiring pain medications Patient is ready to go home  Objective: Vital signs in last 24 hours: Temp:  [98 F (36.7 C)-98.4 F (36.9 C)] 98.4 F (36.9 C) (07/11 0545) Pulse Rate:  [64-72] 72 (07/11 0545) Resp:  [14-16] 16 (07/11 0545) BP: (126-141)/(73-76) 141/76 (07/11 0545) SpO2:  [98 %-100 %] 100 % (07/11 0545)  Intake/Output from previous day:  Intake/Output Summary (Last 24 hours) at 02/12/17 1035 Last data filed at 02/12/17 0921  Gross per 24 hour  Intake          1569.83 ml  Output             1350 ml  Net           219.83 ml    Intake/Output this shift: Total I/O In: 360 [P.O.:360] Out: -   Labs:  Recent Labs  02/11/17 0533 02/12/17 0520  HGB 12.2 12.0    Recent Labs  02/11/17 0533 02/12/17 0520  WBC 11.4* 13.2*  RBC 4.12 4.01  HCT 36.5 35.2*  PLT 245 242    Recent Labs  02/11/17 0533 02/12/17 0520  NA 140 142  K 4.5 4.2  CL 109 106  CO2 25 27  BUN 12 12  CREATININE 0.70 0.56  GLUCOSE 147* 124*  CALCIUM 8.4* 8.9   No results for input(s): LABPT, INR in the last 72 hours.  EXAM: General - Patient is Alert, Appropriate and Oriented Extremity - Neurovascular intact Sensation intact distally Incision - clean, dry Motor Function - intact, moving foot and toes well on exam.   Assessment/Plan: 2 Days Post-Op Procedure(s) (LRB): LEFT TOTAL KNEE ARTHROPLASTY (Left) Procedure(s) (LRB): LEFT TOTAL KNEE ARTHROPLASTY (Left) Past Medical History:  Diagnosis Date  . Allergy 1995   enviromental food, grasses, smoke  . Arthritis    knees  . Complication of anesthesia    versed not reacting well per M.D.  . GERD (gastroesophageal reflux disease)    occasional  . Heart murmur    hx of  ??  . History of kidney  stones   . Hyperlipidemia   . Hypertension   . Pneumonia    walking pneumonia   Principal Problem:   OA (osteoarthritis) of knee  Estimated body mass index is 32.5 kg/m as calculated from the following:   Height as of this encounter: 5\' 1"  (1.549 m).   Weight as of this encounter: 78 kg (172 lb). Up with therapy Diet - Cardiac diet Follow up - in 2 weeks Activity - WBAT Disposition - Home Condition Upon Discharge - Good D/C Meds - See DC Summary DVT Prophylaxis - Xarelto  Avel Peacerew Eveleen Mcnear, PA-C Orthopaedic Surgery 02/12/2017, 10:35 AM

## 2017-02-12 NOTE — Progress Notes (Signed)
Physical Therapy Treatment Patient Details Name: Heather Lambert MRN: [id number] DOB: 05-Jan-1956 Today's Date: 02/12/2017    History of Present Illness Pt is a 61 y/o F s/p L TKA.     PT Comments    Patient reports increased pain. Only ambulated and practiced steps. Encouraged to perform HEP tonight.  Follow Up Recommendations  DC plan and follow up therapy as arranged by surgeon;Outpatient PT     Equipment Recommendations  None recommended by PT    Recommendations for Other Services       Precautions / Restrictions Precautions Precautions: Fall Required Braces or Orthoses: Knee Immobilizer - Left Knee Immobilizer - Left: Discontinue once straight leg raise with < 10 degree lag    Mobility  Bed Mobility   Bed Mobility: Supine to Sit     Supine to sit: Supervision     General bed mobility comments: , manages the left leg  Transfers Overall transfer level: Needs assistance Equipment used: Rolling walker (2 wheeled) Transfers: Sit to/from Stand Sit to Stand: Min guard         General transfer comment: close guard for safety; tends to walk away from the RW.  Ambulation/Gait Ambulation/Gait assistance: Supervision Ambulation Distance (Feet): 100 Feet Assistive device: Rolling walker (2 wheeled)       General Gait Details: cues for sequence and safety, slow pace down.   Stairs Stairs: Yes   Stair Management: Backwards;Sideways;One rail Left;With walker Number of Stairs: 2 General stair comments: preacticed sideways but too unsafe, best  backward with RW  Wheelchair Mobility    Modified Rankin (Stroke Patients Only)       Balance                                            Cognition Arousal/Alertness: Awake/alert                                            Exercises      General Comments        Pertinent Vitals/Pain Pain Score: 8  Pain Location: L knee  Pain Descriptors / Indicators: Aching Pain  Intervention(s): Monitored during session;Premedicated before session;Repositioned;Ice applied;Patient requesting pain meds-RN notified    Home Living                      Prior Function            PT Goals (current goals can now be found in the care plan section) Progress towards PT goals: Progressing toward goals    Frequency    7X/week      PT Plan Current plan remains appropriate    Co-evaluation              AM-PAC PT "6 Clicks" Daily Activity  Outcome Measure  Difficulty turning over in bed (including adjusting bedclothes, sheets and blankets)?: None Difficulty moving from lying on back to sitting on the side of the bed? : None Difficulty sitting down on and standing up from a chair with arms (e.g., wheelchair, bedside commode, etc,.)?: Total Help needed moving to and from a bed to chair (including a wheelchair)?: Total Help needed walking in hospital room?: Total Help needed climbing 3-5 steps with a railing? : Total 6 Click Score:  12    End of Session Equipment Utilized During Treatment: Left knee immobilizer Activity Tolerance: Patient tolerated treatment well Patient left: in chair;with call bell/phone within reach Nurse Communication: Mobility status PT Visit Diagnosis: Difficulty in walking, not elsewhere classified (R26.2);Pain Pain - Right/Left: Left Pain - part of body: Knee     Time: [id number] PT Time Calculation (min) (ACUTE ONLY): 18 min  Charges:  $Gait Training: 8-22 mins                    G Codes:         Heather Lambert, Heather Lambert Heather Lambert 02/12/2017, 4:31 PM

## 2017-02-13 DIAGNOSIS — M170 Bilateral primary osteoarthritis of knee: Secondary | ICD-10-CM | POA: Diagnosis not present

## 2017-02-13 DIAGNOSIS — R262 Difficulty in walking, not elsewhere classified: Secondary | ICD-10-CM | POA: Diagnosis not present

## 2017-02-13 DIAGNOSIS — M6281 Muscle weakness (generalized): Secondary | ICD-10-CM | POA: Diagnosis not present

## 2017-02-13 DIAGNOSIS — M25562 Pain in left knee: Secondary | ICD-10-CM | POA: Diagnosis not present

## 2017-02-17 DIAGNOSIS — M170 Bilateral primary osteoarthritis of knee: Secondary | ICD-10-CM | POA: Diagnosis not present

## 2017-02-17 DIAGNOSIS — M6281 Muscle weakness (generalized): Secondary | ICD-10-CM | POA: Diagnosis not present

## 2017-02-17 DIAGNOSIS — R262 Difficulty in walking, not elsewhere classified: Secondary | ICD-10-CM | POA: Diagnosis not present

## 2017-02-17 DIAGNOSIS — M25562 Pain in left knee: Secondary | ICD-10-CM | POA: Diagnosis not present

## 2017-02-19 DIAGNOSIS — M25562 Pain in left knee: Secondary | ICD-10-CM | POA: Diagnosis not present

## 2017-02-19 DIAGNOSIS — R262 Difficulty in walking, not elsewhere classified: Secondary | ICD-10-CM | POA: Diagnosis not present

## 2017-02-19 DIAGNOSIS — M6281 Muscle weakness (generalized): Secondary | ICD-10-CM | POA: Diagnosis not present

## 2017-02-19 DIAGNOSIS — M170 Bilateral primary osteoarthritis of knee: Secondary | ICD-10-CM | POA: Diagnosis not present

## 2017-02-21 DIAGNOSIS — M170 Bilateral primary osteoarthritis of knee: Secondary | ICD-10-CM | POA: Diagnosis not present

## 2017-02-21 DIAGNOSIS — M6281 Muscle weakness (generalized): Secondary | ICD-10-CM | POA: Diagnosis not present

## 2017-02-21 DIAGNOSIS — M25562 Pain in left knee: Secondary | ICD-10-CM | POA: Diagnosis not present

## 2017-02-21 DIAGNOSIS — R262 Difficulty in walking, not elsewhere classified: Secondary | ICD-10-CM | POA: Diagnosis not present

## 2017-02-24 DIAGNOSIS — M25562 Pain in left knee: Secondary | ICD-10-CM | POA: Diagnosis not present

## 2017-02-24 DIAGNOSIS — M170 Bilateral primary osteoarthritis of knee: Secondary | ICD-10-CM | POA: Diagnosis not present

## 2017-02-24 DIAGNOSIS — R262 Difficulty in walking, not elsewhere classified: Secondary | ICD-10-CM | POA: Diagnosis not present

## 2017-02-24 DIAGNOSIS — M6281 Muscle weakness (generalized): Secondary | ICD-10-CM | POA: Diagnosis not present

## 2017-02-26 DIAGNOSIS — R262 Difficulty in walking, not elsewhere classified: Secondary | ICD-10-CM | POA: Diagnosis not present

## 2017-02-26 DIAGNOSIS — M170 Bilateral primary osteoarthritis of knee: Secondary | ICD-10-CM | POA: Diagnosis not present

## 2017-02-26 DIAGNOSIS — M6281 Muscle weakness (generalized): Secondary | ICD-10-CM | POA: Diagnosis not present

## 2017-02-26 DIAGNOSIS — M25562 Pain in left knee: Secondary | ICD-10-CM | POA: Diagnosis not present

## 2017-02-28 DIAGNOSIS — M170 Bilateral primary osteoarthritis of knee: Secondary | ICD-10-CM | POA: Diagnosis not present

## 2017-02-28 DIAGNOSIS — M25562 Pain in left knee: Secondary | ICD-10-CM | POA: Diagnosis not present

## 2017-02-28 DIAGNOSIS — M6281 Muscle weakness (generalized): Secondary | ICD-10-CM | POA: Diagnosis not present

## 2017-02-28 DIAGNOSIS — R262 Difficulty in walking, not elsewhere classified: Secondary | ICD-10-CM | POA: Diagnosis not present

## 2017-03-03 DIAGNOSIS — M25562 Pain in left knee: Secondary | ICD-10-CM | POA: Diagnosis not present

## 2017-03-03 DIAGNOSIS — M170 Bilateral primary osteoarthritis of knee: Secondary | ICD-10-CM | POA: Diagnosis not present

## 2017-03-03 DIAGNOSIS — R262 Difficulty in walking, not elsewhere classified: Secondary | ICD-10-CM | POA: Diagnosis not present

## 2017-03-03 DIAGNOSIS — M6281 Muscle weakness (generalized): Secondary | ICD-10-CM | POA: Diagnosis not present

## 2017-03-05 DIAGNOSIS — M170 Bilateral primary osteoarthritis of knee: Secondary | ICD-10-CM | POA: Diagnosis not present

## 2017-03-05 DIAGNOSIS — R262 Difficulty in walking, not elsewhere classified: Secondary | ICD-10-CM | POA: Diagnosis not present

## 2017-03-05 DIAGNOSIS — M6281 Muscle weakness (generalized): Secondary | ICD-10-CM | POA: Diagnosis not present

## 2017-03-05 DIAGNOSIS — M25562 Pain in left knee: Secondary | ICD-10-CM | POA: Diagnosis not present

## 2017-03-07 DIAGNOSIS — M25562 Pain in left knee: Secondary | ICD-10-CM | POA: Diagnosis not present

## 2017-03-07 DIAGNOSIS — M170 Bilateral primary osteoarthritis of knee: Secondary | ICD-10-CM | POA: Diagnosis not present

## 2017-03-07 DIAGNOSIS — M6281 Muscle weakness (generalized): Secondary | ICD-10-CM | POA: Diagnosis not present

## 2017-03-07 DIAGNOSIS — R262 Difficulty in walking, not elsewhere classified: Secondary | ICD-10-CM | POA: Diagnosis not present

## 2017-03-10 DIAGNOSIS — M25562 Pain in left knee: Secondary | ICD-10-CM | POA: Diagnosis not present

## 2017-03-10 DIAGNOSIS — M170 Bilateral primary osteoarthritis of knee: Secondary | ICD-10-CM | POA: Diagnosis not present

## 2017-03-10 DIAGNOSIS — R262 Difficulty in walking, not elsewhere classified: Secondary | ICD-10-CM | POA: Diagnosis not present

## 2017-03-10 DIAGNOSIS — M6281 Muscle weakness (generalized): Secondary | ICD-10-CM | POA: Diagnosis not present

## 2017-03-12 DIAGNOSIS — M170 Bilateral primary osteoarthritis of knee: Secondary | ICD-10-CM | POA: Diagnosis not present

## 2017-03-12 DIAGNOSIS — R262 Difficulty in walking, not elsewhere classified: Secondary | ICD-10-CM | POA: Diagnosis not present

## 2017-03-12 DIAGNOSIS — M6281 Muscle weakness (generalized): Secondary | ICD-10-CM | POA: Diagnosis not present

## 2017-03-12 DIAGNOSIS — M25562 Pain in left knee: Secondary | ICD-10-CM | POA: Diagnosis not present

## 2017-03-14 DIAGNOSIS — R262 Difficulty in walking, not elsewhere classified: Secondary | ICD-10-CM | POA: Diagnosis not present

## 2017-03-14 DIAGNOSIS — M6281 Muscle weakness (generalized): Secondary | ICD-10-CM | POA: Diagnosis not present

## 2017-03-14 DIAGNOSIS — M170 Bilateral primary osteoarthritis of knee: Secondary | ICD-10-CM | POA: Diagnosis not present

## 2017-03-14 DIAGNOSIS — M25562 Pain in left knee: Secondary | ICD-10-CM | POA: Diagnosis not present

## 2017-03-17 DIAGNOSIS — M6281 Muscle weakness (generalized): Secondary | ICD-10-CM | POA: Diagnosis not present

## 2017-03-17 DIAGNOSIS — R262 Difficulty in walking, not elsewhere classified: Secondary | ICD-10-CM | POA: Diagnosis not present

## 2017-03-17 DIAGNOSIS — M25562 Pain in left knee: Secondary | ICD-10-CM | POA: Diagnosis not present

## 2017-03-17 DIAGNOSIS — M170 Bilateral primary osteoarthritis of knee: Secondary | ICD-10-CM | POA: Diagnosis not present

## 2017-03-20 DIAGNOSIS — M25562 Pain in left knee: Secondary | ICD-10-CM | POA: Diagnosis not present

## 2017-03-20 DIAGNOSIS — R262 Difficulty in walking, not elsewhere classified: Secondary | ICD-10-CM | POA: Diagnosis not present

## 2017-03-20 DIAGNOSIS — M6281 Muscle weakness (generalized): Secondary | ICD-10-CM | POA: Diagnosis not present

## 2017-03-20 DIAGNOSIS — M170 Bilateral primary osteoarthritis of knee: Secondary | ICD-10-CM | POA: Diagnosis not present

## 2017-03-24 ENCOUNTER — Ambulatory Visit
Admission: RE | Admit: 2017-03-24 | Discharge: 2017-03-24 | Disposition: A | Discharge status: Home / Self Care | Payer: BLUE CROSS/BLUE SHIELD | Source: Ambulatory Visit | Attending: Family Medicine | Admitting: Family Medicine

## 2017-03-24 DIAGNOSIS — M25562 Pain in left knee: Secondary | ICD-10-CM | POA: Diagnosis not present

## 2017-03-24 DIAGNOSIS — Z1231 Encounter for screening mammogram for malignant neoplasm of breast: Secondary | ICD-10-CM | POA: Diagnosis not present

## 2017-03-24 DIAGNOSIS — R262 Difficulty in walking, not elsewhere classified: Secondary | ICD-10-CM | POA: Diagnosis not present

## 2017-03-24 DIAGNOSIS — M170 Bilateral primary osteoarthritis of knee: Secondary | ICD-10-CM | POA: Diagnosis not present

## 2017-03-24 DIAGNOSIS — M6281 Muscle weakness (generalized): Secondary | ICD-10-CM | POA: Diagnosis not present

## 2017-03-25 DIAGNOSIS — Z471 Aftercare following joint replacement surgery: Secondary | ICD-10-CM | POA: Diagnosis not present

## 2017-03-25 DIAGNOSIS — Z96652 Presence of left artificial knee joint: Secondary | ICD-10-CM | POA: Diagnosis not present

## 2017-03-27 DIAGNOSIS — M25562 Pain in left knee: Secondary | ICD-10-CM | POA: Diagnosis not present

## 2017-03-27 DIAGNOSIS — R262 Difficulty in walking, not elsewhere classified: Secondary | ICD-10-CM | POA: Diagnosis not present

## 2017-03-27 DIAGNOSIS — M170 Bilateral primary osteoarthritis of knee: Secondary | ICD-10-CM | POA: Diagnosis not present

## 2017-03-27 DIAGNOSIS — M6281 Muscle weakness (generalized): Secondary | ICD-10-CM | POA: Diagnosis not present

## 2017-03-31 DIAGNOSIS — I10 Essential (primary) hypertension: Secondary | ICD-10-CM | POA: Diagnosis not present

## 2017-03-31 DIAGNOSIS — M25562 Pain in left knee: Secondary | ICD-10-CM | POA: Diagnosis not present

## 2017-03-31 DIAGNOSIS — E785 Hyperlipidemia, unspecified: Secondary | ICD-10-CM | POA: Diagnosis not present

## 2017-03-31 DIAGNOSIS — M1990 Unspecified osteoarthritis, unspecified site: Secondary | ICD-10-CM | POA: Diagnosis not present

## 2017-03-31 DIAGNOSIS — Z13220 Encounter for screening for lipoid disorders: Secondary | ICD-10-CM | POA: Diagnosis not present

## 2017-03-31 DIAGNOSIS — M170 Bilateral primary osteoarthritis of knee: Secondary | ICD-10-CM | POA: Diagnosis not present

## 2017-03-31 DIAGNOSIS — Z6832 Body mass index (BMI) 32.0-32.9, adult: Secondary | ICD-10-CM | POA: Diagnosis not present

## 2017-03-31 DIAGNOSIS — R262 Difficulty in walking, not elsewhere classified: Secondary | ICD-10-CM | POA: Diagnosis not present

## 2017-03-31 DIAGNOSIS — M6281 Muscle weakness (generalized): Secondary | ICD-10-CM | POA: Diagnosis not present

## 2017-04-03 DIAGNOSIS — R262 Difficulty in walking, not elsewhere classified: Secondary | ICD-10-CM | POA: Diagnosis not present

## 2017-04-03 DIAGNOSIS — M25562 Pain in left knee: Secondary | ICD-10-CM | POA: Diagnosis not present

## 2017-04-03 DIAGNOSIS — M6281 Muscle weakness (generalized): Secondary | ICD-10-CM | POA: Diagnosis not present

## 2017-04-03 DIAGNOSIS — M170 Bilateral primary osteoarthritis of knee: Secondary | ICD-10-CM | POA: Diagnosis not present

## 2017-04-08 DIAGNOSIS — M6281 Muscle weakness (generalized): Secondary | ICD-10-CM | POA: Diagnosis not present

## 2017-04-08 DIAGNOSIS — M25562 Pain in left knee: Secondary | ICD-10-CM | POA: Diagnosis not present

## 2017-04-08 DIAGNOSIS — R262 Difficulty in walking, not elsewhere classified: Secondary | ICD-10-CM | POA: Diagnosis not present

## 2017-04-08 DIAGNOSIS — M170 Bilateral primary osteoarthritis of knee: Secondary | ICD-10-CM | POA: Diagnosis not present

## 2017-04-10 DIAGNOSIS — R262 Difficulty in walking, not elsewhere classified: Secondary | ICD-10-CM | POA: Diagnosis not present

## 2017-04-10 DIAGNOSIS — M25562 Pain in left knee: Secondary | ICD-10-CM | POA: Diagnosis not present

## 2017-04-10 DIAGNOSIS — M170 Bilateral primary osteoarthritis of knee: Secondary | ICD-10-CM | POA: Diagnosis not present

## 2017-04-10 DIAGNOSIS — M6281 Muscle weakness (generalized): Secondary | ICD-10-CM | POA: Diagnosis not present

## 2017-04-15 DIAGNOSIS — M6281 Muscle weakness (generalized): Secondary | ICD-10-CM | POA: Diagnosis not present

## 2017-04-15 DIAGNOSIS — M170 Bilateral primary osteoarthritis of knee: Secondary | ICD-10-CM | POA: Diagnosis not present

## 2017-04-15 DIAGNOSIS — R262 Difficulty in walking, not elsewhere classified: Secondary | ICD-10-CM | POA: Diagnosis not present

## 2017-04-15 DIAGNOSIS — M25562 Pain in left knee: Secondary | ICD-10-CM | POA: Diagnosis not present

## 2017-04-16 DIAGNOSIS — Z13220 Encounter for screening for lipoid disorders: Secondary | ICD-10-CM | POA: Diagnosis not present

## 2017-04-16 DIAGNOSIS — E559 Vitamin D deficiency, unspecified: Secondary | ICD-10-CM | POA: Diagnosis not present

## 2017-04-16 DIAGNOSIS — Z0000 Encounter for general adult medical examination without abnormal findings: Secondary | ICD-10-CM | POA: Diagnosis not present

## 2017-04-17 DIAGNOSIS — M170 Bilateral primary osteoarthritis of knee: Secondary | ICD-10-CM | POA: Diagnosis not present

## 2017-04-17 DIAGNOSIS — R262 Difficulty in walking, not elsewhere classified: Secondary | ICD-10-CM | POA: Diagnosis not present

## 2017-04-17 DIAGNOSIS — M6281 Muscle weakness (generalized): Secondary | ICD-10-CM | POA: Diagnosis not present

## 2017-04-17 DIAGNOSIS — M25562 Pain in left knee: Secondary | ICD-10-CM | POA: Diagnosis not present

## 2017-04-18 DIAGNOSIS — Z23 Encounter for immunization: Secondary | ICD-10-CM | POA: Diagnosis not present

## 2017-04-18 DIAGNOSIS — Z0000 Encounter for general adult medical examination without abnormal findings: Secondary | ICD-10-CM | POA: Diagnosis not present

## 2017-04-18 DIAGNOSIS — Z6832 Body mass index (BMI) 32.0-32.9, adult: Secondary | ICD-10-CM | POA: Diagnosis not present

## 2017-04-18 DIAGNOSIS — Z1211 Encounter for screening for malignant neoplasm of colon: Secondary | ICD-10-CM | POA: Diagnosis not present

## 2017-04-21 DIAGNOSIS — R262 Difficulty in walking, not elsewhere classified: Secondary | ICD-10-CM | POA: Diagnosis not present

## 2017-04-21 DIAGNOSIS — M25562 Pain in left knee: Secondary | ICD-10-CM | POA: Diagnosis not present

## 2017-04-21 DIAGNOSIS — M170 Bilateral primary osteoarthritis of knee: Secondary | ICD-10-CM | POA: Diagnosis not present

## 2017-04-21 DIAGNOSIS — M6281 Muscle weakness (generalized): Secondary | ICD-10-CM | POA: Diagnosis not present

## 2017-04-29 DIAGNOSIS — M6281 Muscle weakness (generalized): Secondary | ICD-10-CM | POA: Diagnosis not present

## 2017-04-29 DIAGNOSIS — M170 Bilateral primary osteoarthritis of knee: Secondary | ICD-10-CM | POA: Diagnosis not present

## 2017-04-29 DIAGNOSIS — M25562 Pain in left knee: Secondary | ICD-10-CM | POA: Diagnosis not present

## 2017-04-29 DIAGNOSIS — R262 Difficulty in walking, not elsewhere classified: Secondary | ICD-10-CM | POA: Diagnosis not present

## 2017-05-01 DIAGNOSIS — M6281 Muscle weakness (generalized): Secondary | ICD-10-CM | POA: Diagnosis not present

## 2017-05-01 DIAGNOSIS — M25562 Pain in left knee: Secondary | ICD-10-CM | POA: Diagnosis not present

## 2017-05-01 DIAGNOSIS — R262 Difficulty in walking, not elsewhere classified: Secondary | ICD-10-CM | POA: Diagnosis not present

## 2017-05-01 DIAGNOSIS — M170 Bilateral primary osteoarthritis of knee: Secondary | ICD-10-CM | POA: Diagnosis not present

## 2017-05-06 DIAGNOSIS — M6281 Muscle weakness (generalized): Secondary | ICD-10-CM | POA: Diagnosis not present

## 2017-05-06 DIAGNOSIS — M170 Bilateral primary osteoarthritis of knee: Secondary | ICD-10-CM | POA: Diagnosis not present

## 2017-05-06 DIAGNOSIS — R262 Difficulty in walking, not elsewhere classified: Secondary | ICD-10-CM | POA: Diagnosis not present

## 2017-05-06 DIAGNOSIS — M25562 Pain in left knee: Secondary | ICD-10-CM | POA: Diagnosis not present

## 2017-05-08 DIAGNOSIS — M25562 Pain in left knee: Secondary | ICD-10-CM | POA: Diagnosis not present

## 2017-05-08 DIAGNOSIS — M170 Bilateral primary osteoarthritis of knee: Secondary | ICD-10-CM | POA: Diagnosis not present

## 2017-05-08 DIAGNOSIS — R262 Difficulty in walking, not elsewhere classified: Secondary | ICD-10-CM | POA: Diagnosis not present

## 2017-05-08 DIAGNOSIS — M6281 Muscle weakness (generalized): Secondary | ICD-10-CM | POA: Diagnosis not present

## 2017-05-13 DIAGNOSIS — R262 Difficulty in walking, not elsewhere classified: Secondary | ICD-10-CM | POA: Diagnosis not present

## 2017-05-13 DIAGNOSIS — M170 Bilateral primary osteoarthritis of knee: Secondary | ICD-10-CM | POA: Diagnosis not present

## 2017-05-13 DIAGNOSIS — M6281 Muscle weakness (generalized): Secondary | ICD-10-CM | POA: Diagnosis not present

## 2017-05-13 DIAGNOSIS — M25562 Pain in left knee: Secondary | ICD-10-CM | POA: Diagnosis not present

## 2017-05-15 DIAGNOSIS — M25562 Pain in left knee: Secondary | ICD-10-CM | POA: Diagnosis not present

## 2017-05-15 DIAGNOSIS — M6281 Muscle weakness (generalized): Secondary | ICD-10-CM | POA: Diagnosis not present

## 2017-05-15 DIAGNOSIS — R262 Difficulty in walking, not elsewhere classified: Secondary | ICD-10-CM | POA: Diagnosis not present

## 2017-05-15 DIAGNOSIS — M170 Bilateral primary osteoarthritis of knee: Secondary | ICD-10-CM | POA: Diagnosis not present

## 2017-05-21 ENCOUNTER — Encounter: Payer: Self-pay | Admitting: Family Medicine

## 2017-05-21 DIAGNOSIS — M170 Bilateral primary osteoarthritis of knee: Secondary | ICD-10-CM | POA: Diagnosis not present

## 2017-05-21 DIAGNOSIS — M6281 Muscle weakness (generalized): Secondary | ICD-10-CM | POA: Diagnosis not present

## 2017-05-21 DIAGNOSIS — M25562 Pain in left knee: Secondary | ICD-10-CM | POA: Diagnosis not present

## 2017-05-21 DIAGNOSIS — R262 Difficulty in walking, not elsewhere classified: Secondary | ICD-10-CM | POA: Diagnosis not present

## 2017-06-04 DIAGNOSIS — M6281 Muscle weakness (generalized): Secondary | ICD-10-CM | POA: Diagnosis not present

## 2017-06-04 DIAGNOSIS — M170 Bilateral primary osteoarthritis of knee: Secondary | ICD-10-CM | POA: Diagnosis not present

## 2017-06-04 DIAGNOSIS — R262 Difficulty in walking, not elsewhere classified: Secondary | ICD-10-CM | POA: Diagnosis not present

## 2017-06-04 DIAGNOSIS — M25562 Pain in left knee: Secondary | ICD-10-CM | POA: Diagnosis not present

## 2017-07-17 DIAGNOSIS — M1711 Unilateral primary osteoarthritis, right knee: Secondary | ICD-10-CM | POA: Diagnosis not present

## 2017-07-17 DIAGNOSIS — Z96652 Presence of left artificial knee joint: Secondary | ICD-10-CM | POA: Diagnosis not present

## 2017-07-17 DIAGNOSIS — M1712 Unilateral primary osteoarthritis, left knee: Secondary | ICD-10-CM | POA: Diagnosis not present

## 2017-07-17 DIAGNOSIS — Z471 Aftercare following joint replacement surgery: Secondary | ICD-10-CM | POA: Diagnosis not present

## 2017-09-19 DIAGNOSIS — Z23 Encounter for immunization: Secondary | ICD-10-CM | POA: Diagnosis not present

## 2017-09-24 NOTE — Progress Notes (Signed)
Please place orders in Epic as patient is being scheduled for a pre-op appointment! Thank you! 

## 2017-09-28 ENCOUNTER — Ambulatory Visit: Payer: Self-pay | Admitting: Orthopedic Surgery

## 2017-10-17 DIAGNOSIS — I10 Essential (primary) hypertension: Secondary | ICD-10-CM | POA: Diagnosis not present

## 2017-10-17 DIAGNOSIS — M179 Osteoarthritis of knee, unspecified: Secondary | ICD-10-CM | POA: Diagnosis not present

## 2017-10-17 DIAGNOSIS — J309 Allergic rhinitis, unspecified: Secondary | ICD-10-CM | POA: Diagnosis not present

## 2017-10-17 DIAGNOSIS — Z6833 Body mass index (BMI) 33.0-33.9, adult: Secondary | ICD-10-CM | POA: Diagnosis not present

## 2017-10-17 DIAGNOSIS — Z23 Encounter for immunization: Secondary | ICD-10-CM | POA: Diagnosis not present

## 2017-10-17 NOTE — Patient Instructions (Addendum)
Heather Lambert  10/17/2017   Your procedure is scheduled on: Monday 10/27/2017   Report [facility] Main  Entrance   Report to admitting at   0905 AM    Call this number if you have problems the morning of surgery [phone]    Remember: Do not eat food or drink liquids :After Midnight.     Take these medicines the morning of surgery with A SIP OF WATER: none                                You may not have any metal on your body including hair pins and              piercings  Do not wear jewelry, make-up, lotions, powders or perfumes, deodorant             Do not wear nail polish.  Do not shave  48 hours prior to surgery.              Men may shave face and neck.   Do not bring valuables to the hospital. [facility] IS NOT             RESPONSIBLE   FOR VALUABLES.  Contacts, dentures or bridgework may not be worn into surgery.  Leave suitcase in the car. After surgery it may be brought to your room.                  Please read over the following fact sheets you were given: _____________________________________________________________________             [facility] - Preparing for [facility] Before surgery, you can play an important role.  Because skin is not sterile, your skin needs to be as free of germs as possible.  You can reduce the number of germs on your skin by washing with CHG (chlorahexidine gluconate) soap before surgery.  CHG is an antiseptic cleaner which kills germs and bonds with the skin to continue killing germs even after washing. Please DO NOT use if you have an allergy to CHG or antibacterial soaps.  If your skin becomes reddened/irritated stop using the CHG and inform your nurse when you arrive at Short Stay. Do not shave (including legs and underarms) for at least 48 hours prior to the first CHG shower.  You may shave your face/neck. Please follow these instructions carefully:  1.  Shower with CHG Soap the night before surgery  and the  morning of Surgery.  2.  If you choose to wash your hair, wash your hair first as usual with your  normal  shampoo.  3.  After you shampoo, rinse your hair and body thoroughly to remove the  shampoo.                           4.  Use CHG as you would any other liquid soap.  You can apply chg directly  to the skin and wash                       Gently with a scrungie or clean washcloth.  5.  Apply the CHG Soap to your body ONLY FROM THE NECK DOWN.   Do not use on face/ open  Wound or open sores. Avoid contact with eyes, ears mouth and genitals (private parts).                       Wash face,  Genitals (private parts) with your normal soap.             6.  Wash thoroughly, paying special attention to the area where your surgery  will be performed.  7.  Thoroughly rinse your body with warm water from the neck down.  8.  DO NOT shower/wash with your normal soap after using and rinsing off  the CHG Soap.                9.  Pat yourself dry with a clean towel.            10.  Wear clean pajamas.            11.  Place clean sheets on your bed the night of your first shower and do not  sleep with pets. Day of Surgery : Do not apply any lotions/deodorants the morning of surgery.  Please wear clean clothes to the hospital/surgery center.  FAILURE TO FOLLOW THESE INSTRUCTIONS MAY RESULT IN THE CANCELLATION OF YOUR SURGERY PATIENT SIGNATURE_________________________________  NURSE SIGNATURE__________________________________  ________________________________________________________________________   [name]  An incentive spirometer is a tool that can help keep your lungs clear and active. This tool measures how well you are filling your lungs with each breath. Taking long deep breaths may help reverse or decrease the chance of developing breathing (pulmonary) problems (especially infection) following:  A long period of time when you are unable to move or  be active. BEFORE THE PROCEDURE   If the spirometer includes an indicator to show your best effort, your nurse or respiratory therapist will set it to a desired goal.  If possible, sit up straight or lean slightly forward. Try not to slouch.  Hold the incentive spirometer in an upright position. INSTRUCTIONS FOR USE  1. Sit on the edge of your bed if possible, or sit up as far as you can in bed or on a chair. 2. Hold the incentive spirometer in an upright position. 3. Breathe out normally. 4. Place the mouthpiece in your mouth and seal your lips tightly around it. 5. Breathe in slowly and as deeply as possible, raising the piston or the ball toward the top of the column. 6. Hold your breath for 3-5 seconds or for as long as possible. Allow the piston or ball to fall to the bottom of the column. 7. Remove the mouthpiece from your mouth and breathe out normally. 8. Rest for a few seconds and repeat Steps 1 through 7 at least 10 times every 1-2 hours when you are awake. Take your time and take a few normal breaths between deep breaths. 9. The spirometer may include an indicator to show your best effort. Use the indicator as a goal to work toward during each repetition. 10. After each set of 10 deep breaths, practice coughing to be sure your lungs are clear. If you have an incision (the cut made at the time of surgery), support your incision when coughing by placing a pillow or rolled up towels firmly against it. Once you are able to get out of bed, walk around indoors and cough well. You may stop using the incentive spirometer when instructed by your caregiver.  RISKS AND COMPLICATIONS  Take your time so you do not get  dizzy or light-headed.  If you are in pain, you may need to take or ask for pain medication before doing incentive spirometry. It is harder to take a deep breath if you are having pain. AFTER USE  Rest and breathe slowly and easily.  It can be helpful to keep track of a log  of your progress. Your caregiver can provide you with a simple table to help with this. If you are using the spirometer at home, follow these instructions: [place] IF:   You are having difficultly using the spirometer.  You have trouble using the spirometer as often as instructed.  Your pain medication is not giving enough relief while using the spirometer.  You develop fever of 100.5 F (38.1 C) or higher. SEEK IMMEDIATE MEDICAL CARE IF:   You cough up bloody sputum that had not been present before.  You develop fever of 102 F (38.9 C) or greater.  You develop worsening pain at or near the incision site. MAKE SURE YOU:   Understand these instructions.  Will watch your condition.  Will get help right away if you are not doing well or get worse. Document Released: 12/02/2006 Document Revised: 10/14/2011 Document Reviewed: 02/02/2007 ExitCare Patient Information 2014 ExitCare, [place].   ________________________________________________________________________  WHAT IS A BLOOD TRANSFUSION? Blood Transfusion Information  A transfusion is the replacement of blood or some of its parts. Blood is made up of multiple cells which provide different functions.  Red blood cells carry oxygen and are used for blood loss replacement.  White blood cells fight against infection.  Platelets control bleeding.  Plasma helps clot blood.  Other blood products are available for specialized needs, such as hemophilia or other clotting disorders. BEFORE THE TRANSFUSION  Who gives blood for transfusions?   Healthy volunteers who are fully evaluated to make sure their blood is safe. This is blood bank blood. Transfusion therapy is the safest it has ever been in the practice of medicine. Before blood is taken from a donor, a complete history is taken to make sure that person has no history of diseases nor engages in risky social behavior (examples are intravenous drug use or sexual  activity with multiple partners). The donor's travel history is screened to minimize risk of transmitting infections, such as malaria. The donated blood is tested for signs of infectious diseases, such as HIV and hepatitis. The blood is then tested to be sure it is compatible with you in order to minimize the chance of a transfusion reaction. If you or a relative donates blood, this is often done in anticipation of surgery and is not appropriate for emergency situations. It takes many days to process the donated blood. RISKS AND COMPLICATIONS Although transfusion therapy is very safe and saves many lives, the main dangers of transfusion include:   Getting an infectious disease.  Developing a transfusion reaction. This is an allergic reaction to something in the blood you were given. Every precaution is taken to prevent this. The decision to have a blood transfusion has been considered carefully by your caregiver before blood is given. Blood is not given unless the benefits outweigh the risks. AFTER THE TRANSFUSION  Right after receiving a blood transfusion, you will usually feel much better and more energetic. This is especially true if your red blood cells have gotten low (anemic). The transfusion raises the level of the red blood cells which carry oxygen, and this usually causes an energy increase.  The nurse administering the transfusion will  monitor you carefully for complications. HOME CARE INSTRUCTIONS  No special instructions are needed after a transfusion. You may find your energy is better. Speak with your caregiver about any limitations on activity for underlying diseases you may have. SEEK MEDICAL CARE IF:   Your condition is not improving after your transfusion.  You develop redness or irritation at the intravenous (IV) site. SEEK IMMEDIATE MEDICAL CARE IF:  Any of the following symptoms occur over the next 12 hours:  Shaking chills.  You have a temperature by mouth above 102 F  (38.9 C), not controlled by medicine.  Chest, back, or muscle pain.  People around you feel you are not acting correctly or are confused.  Shortness of breath or difficulty breathing.  Dizziness and fainting.  You get a rash or develop hives.  You have a decrease in urine output.  Your urine turns a dark color or changes to pink, red, or brown. Any of the following symptoms occur over the next 10 days:  You have a temperature by mouth above 102 F (38.9 C), not controlled by medicine.  Shortness of breath.  Weakness after normal activity.  The white part of the eye turns yellow (jaundice).  You have a decrease in the amount of urine or are urinating less often.  Your urine turns a dark color or changes to pink, red, or brown. Document Released: 07/19/2000 Document Revised: 10/14/2011 Document Reviewed: 03/07/2008 [facility] Patient Information 2014 [place], [place].  _______________________________________________________________________

## 2017-10-20 ENCOUNTER — Other Ambulatory Visit: Payer: Self-pay

## 2017-10-20 ENCOUNTER — Encounter (HOSPITAL_COMMUNITY): Payer: Self-pay

## 2017-10-20 ENCOUNTER — Ambulatory Visit: Payer: Self-pay | Admitting: Orthopedic Surgery

## 2017-10-20 ENCOUNTER — Encounter (HOSPITAL_COMMUNITY)
Admission: RE | Admit: 2017-10-20 | Discharge: 2017-10-20 | Disposition: A | Discharge status: Home / Self Care | Payer: BLUE CROSS/BLUE SHIELD | Source: Ambulatory Visit | Attending: Orthopedic Surgery | Admitting: Orthopedic Surgery

## 2017-10-20 DIAGNOSIS — M1711 Unilateral primary osteoarthritis, right knee: Secondary | ICD-10-CM | POA: Diagnosis not present

## 2017-10-20 DIAGNOSIS — Z01818 Encounter for other preprocedural examination: Secondary | ICD-10-CM | POA: Insufficient documentation

## 2017-10-20 LAB — COMPREHENSIVE METABOLIC PANEL
ALK PHOS: 78 U/L (ref 38–126)
ALT: 21 U/L (ref 14–54)
AST: 29 U/L (ref 15–41)
Albumin: 4.3 g/dL (ref 3.5–5.0)
Anion gap: 10 (ref 5–15)
BUN: 16 mg/dL (ref 6–20)
CALCIUM: 9.3 mg/dL (ref 8.9–10.3)
CHLORIDE: 108 mmol/L (ref 101–111)
CO2: 24 mmol/L (ref 22–32)
CREATININE: 0.68 mg/dL (ref 0.44–1.00)
Glucose, Bld: 92 mg/dL (ref 65–99)
Potassium: 4.3 mmol/L (ref 3.5–5.1)
Sodium: 142 mmol/L (ref 135–145)
Total Bilirubin: 1 mg/dL (ref 0.3–1.2)
Total Protein: 7.3 g/dL (ref 6.5–8.1)

## 2017-10-20 LAB — CBC
HEMATOCRIT: 46 % (ref 36.0–46.0)
HEMOGLOBIN: 15 g/dL (ref 12.0–15.0)
MCH: 29.2 pg (ref 26.0–34.0)
MCHC: 32.6 g/dL (ref 30.0–36.0)
MCV: 89.7 fL (ref 78.0–100.0)
Platelets: 272 10*3/uL (ref 150–400)
RBC: 5.13 MIL/uL — AB (ref 3.87–5.11)
RDW: 12.8 % (ref 11.5–15.5)
WBC: 4.7 10*3/uL (ref 4.0–10.5)

## 2017-10-20 LAB — PROTIME-INR
INR: 1.06
PROTHROMBIN TIME: 13.7 s (ref 11.4–15.2)

## 2017-10-20 LAB — APTT: aPTT: 28 seconds (ref 24–36)

## 2017-10-20 LAB — SURGICAL PCR SCREEN
MRSA, PCR: NEGATIVE
Staphylococcus aureus: POSITIVE — AB

## 2017-10-20 NOTE — H&P (View-Only) (Signed)
Heather Lambert, GRUENHAGEN (62yo, F) DOB 1956/05/26    Chief Complaint Right Knee Pain H&P Right Total Knee Arthroplasty - 10/27/2017  Patient's Care Team Primary Care Provider: Fanny Bien MD: [address], [place], [place] [address], Ph [phone], Fax [phone] NPI: [id number] Patient's Pharmacies [id number]: [address] [id number] Ph [phone] Fax [phone]  Vitals Ht: 5 ft 1.5 in  Wt: 175 lbs  BMI: 32.5  BP: 142/90 sitting L arm Pulse: 88 bpm    Allergies Reviewed Allergies NICKEL  VERSED: - unkonwn RXN     Medications Reviewed Medications Aleve 10/02/17   entered ALEXZANDREW PERKINS, PA-C multivitamin 10/02/17   entered ALEXZANDREW PERKINS, PA-C olmesartan 40 mg tablet 09/22/17   filled PRIME rosuvastatin 5 mg tablet 09/12/17   filled PRIME Vitamin D3 10/02/17   entered ALEXZANDREW PERKINS, PA-C    Family History Father - Father deceased (died age: 3086   - Family history of cancer   - Family history of malignant neoplasm of lung Mother - Mother deceased (died age: 7342   - History of depression   - Osteoarthritis Maternal Grandmother - Family history of cancer   - Carcinoma in situ of uterus   - Dementia Paternal Grandmother - Family history of cancer   - Multiple myeloma Paternal Grandfather - Cerebrovascular accident Brother - Hypercholesterolemia   - Sleep apnea   Social History Reviewed Social History Smoking Status: Never smoker Non-smoker Occupation: Photographers Chewing tobacco: none Alcohol intake: Occasional Hand Dominance: Right Work related injury?: N Advance directive: YBessieof Attorney: Y   Surgical History Reviewed Surgical History Cystoscopy - 2010 Lithotripsy - 2010 Procedure on retina - Laser Surgery - 2011 Dilation and curettage of uterus - 2014 Adenoid Surgery - 1965 Knee Joint Replacement - Left Total Knee - 2018 Tonsillectomy -  1965 Colonoscopy - 08/05/1997 Past Medical History Reviewed Past Medical History High Cholesterol: Y Hypertension: Y Joint Pain: Y Osteoarthritis: Y Previous Fracture(s): Y Notes: Hemorrhoids, Kidney Stones, UTI's, Postmenopausal   HPI Patient is 62year old female scheduled for a right total knee [facility]ynelle [facility] The patient is a 62year old female who presented with bilateral knee complaints. The patient initially reported left knee (worse than right) symptoms including: pain which began 4 year(s) ago without any known injury (onset after walking the dogs. She does state that she was diagnosed with osteoarthritis in 2006).The patient feels that the symptoms are worsening. Previous work-up for this problem has included knee x-rays. Past treatment for this problem has included application of ice, application of heat, restricted activities and physical therapy. and include knee pain, stiffness, decreased range of motion, locking, difficulty bearing weight and difficulty ambulating. Current treatment includes application of heat, application of ice, restricted activity (she has been doing some stretching exercises, but can not really ride her bike anymore) and nonsteroidal anti-inflammatory drugs (Aleve, prn). Both knees have been very problematic for a long time now. Left knee has always been a little worse than the right. She had lost motion in both of her knees. It is getting harder to walk. It is affecting her back and her hips. She is at a stage now where she is having a very hard time functioning because of the knee pain and knee dysfunction. She has tried exercising which has not helped. She has had injections in the past which have not helped. She has recently underwent  a left total kne replacement over 7 months ago and is now doing much better from that standpoint. She is now ready to get the right knee fixed at this time. They have been treated  conservatively in the past for the above stated problem and despite conservative measures, they continue to have progressive pain and severe functional limitations and dysfunction. They have failed non-operative management including home exercise, medications, and injections. It is felt that they would benefit from undergoing total joint replacement on the right. Risks and benefits of the procedure have been discussed with the patient and they elect to proceed with surgery. There are no active contraindications to surgery such as ongoing infection or rapidly progressive neurological disease.   ROS Constitutional: Constitutional: no fever, chills, night sweats, or significant weight loss.  Cardiovascular: Cardiovascular: high blood pressure.  Respiratory: Respiratory: no cough or shortness of breath and No COPD.  Gastrointestinal: Gastrointestinal: no vomiting or nausea.  Musculoskeletal: Musculoskeletal: Joint Pain; catching and locking, stiffness, muscle cramps, decreased range of motion.  Neurologic: Neurologic: no numbness, tingling, or difficulty with balance.   Physical Exam Patient is a 62 year old female.  General Mental Status - Alert, cooperative and good historian. General Appearance - pleasant, Not in acute distress. Orientation - Oriented X3. Build & Nutrition - Well nourished and Well developed.  Head and Neck Head - normocephalic, atraumatic . Neck Global Assessment - supple, no bruit auscultated on the right, no bruit auscultated on the left.  Eye Wears Glasses Pupil - Bilateral - PERR Motion - Bilateral - EOMI.  Chest and Lung Exam Auscultation Breath sounds - clear at anterior chest wall and clear at posterior chest wall. Adventitious sounds - No Adventitious sounds.  Cardiovascular Auscultation Rhythm - Regular rate and rhythm. Heart Sounds - S1 WNL and S2 WNL. Murmurs & Other Heart Sounds - Auscultation of the heart reveals - No  Murmurs.  Abdomen Palpation/Percussion Tenderness - Abdomen is non-tender to palpation. Abdomen is soft. Auscultation Auscultation of the abdomen reveals - Bowel sounds normal.  Genitourinary Note: Not done, not pertinent to present illness  Musculoskeletal LEFT knee looks excellent. There is no swelling. Range of motion is 0-95 actively and 105 passively. There is no instability. RIGHT knee shows no effusion. Her range of motion is 15-95. There is marked crepitus on range of motion with tenderness medial greater than lateral and no instability. Radiographs AP and lateral of the LEFT knee show her prosthesis to be in excellent position with no periprosthetic abnormalities. I reviewed previous radiographs of the RIGHT knee from last year and she has severe bone-on-bone arthritis in all 3 compartments with large osteophyte formation.    Assessment / Plan 1. Osteoarthritis of right knee joint M17.11: Unilateral primary osteoarthritis, right knee  2. 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 Ernie Hew IV TXA Anesthesia Issues: Possible issues with Versed Patient was instructed on what medications to stop prior to surgery. - Follow up visit in 2 weeks with Dr. Wynelle Link - Begin physical therapy following surgery. PHYSICAL THERAPY REFERRAL - to setup up first PT Eval on Thursday 10/30/2017 - Pre-operative lab work as pre Pre-Surgical Testing - Prescriptions will be provided in hospital at time of discharge  Return to [place], MD for 5[facility]Op [facility] on 11/11/2017 at 01:45 PM  Encounter signed-off by Mickel Crow, PA-C

## 2017-10-20 NOTE — H&P (Signed)
Heather Lambert, Heather Lambert (62yo, F) DOB 1956/05/26    Chief Complaint Right Knee Pain H&P Right Total Knee Arthroplasty - 10/27/2017  Patient's Care Team Primary Care Provider: Fanny Bien MD: [facility], [place], [place] [address], Ph [phone], Fax [phone] NPI: [id number] Patient's Pharmacies [id number]: [address] [id number] Ph [phone] Fax [phone]  Vitals Ht: 5 ft 1.5 in  Wt: 175 lbs  BMI: 32.5  BP: 142/90 sitting L arm Pulse: 88 bpm    Allergies Reviewed Allergies NICKEL  VERSED: - unkonwn RXN     Medications Reviewed Medications Aleve 10/02/17   entered ALEXZANDREW PERKINS, PA-C multivitamin 10/02/17   entered ALEXZANDREW PERKINS, PA-C olmesartan 40 mg tablet 09/22/17   filled PRIME rosuvastatin 5 mg tablet 09/12/17   filled PRIME Vitamin D3 10/02/17   entered ALEXZANDREW PERKINS, PA-C    Family History Father - Father deceased (died age: 3086   - Family history of cancer   - Family history of malignant neoplasm of lung Mother - Mother deceased (died age: 7342   - History of depression   - Osteoarthritis Maternal Grandmother - Family history of cancer   - Carcinoma in situ of uterus   - Dementia Paternal Grandmother - Family history of cancer   - Multiple myeloma Paternal Grandfather - Cerebrovascular accident Brother - Hypercholesterolemia   - Sleep apnea   Social History Reviewed Social History Smoking Status: Never smoker Non-smoker Occupation: Photographers Chewing tobacco: none Alcohol intake: Occasional Hand Dominance: Right Work related injury?: N Advance directive: YBessieof Attorney: Y   Surgical History Reviewed Surgical History Cystoscopy - 2010 Lithotripsy - 2010 Procedure on retina - Laser Surgery - 2011 Dilation and curettage of uterus - 2014 Adenoid Surgery - 1965 Knee Joint Replacement - Left Total Knee - 2018 Tonsillectomy -  1965 Colonoscopy - 08/05/1997 Past Medical History Reviewed Past Medical History High Cholesterol: Y Hypertension: Y Joint Pain: Y Osteoarthritis: Y Previous Fracture(s): Y Notes: Hemorrhoids, Kidney Stones, UTI's, Postmenopausal   HPI Patient is 62year old female scheduled for a right total knee [facility]elle [facility] The patient is a 62year old female who presented with bilateral knee complaints. The patient initially reported left knee (worse than right) symptoms including: pain which began 4 year(s) ago without any known injury (onset after walking the dogs. She does state that she was diagnosed with osteoarthritis in 2006).The patient feels that the symptoms are worsening. Previous work-up for this problem has included knee x-rays. Past treatment for this problem has included application of ice, application of heat, restricted activities and physical therapy. and include knee pain, stiffness, decreased range of motion, locking, difficulty bearing weight and difficulty ambulating. Current treatment includes application of heat, application of ice, restricted activity (she has been doing some stretching exercises, but can not really ride her bike anymore) and nonsteroidal anti-inflammatory drugs (Aleve, prn). Both knees have been very problematic for a long time now. Left knee has always been a little worse than the right. She had lost motion in both of her knees. It is getting harder to walk. It is affecting her back and her hips. She is at a stage now where she is having a very hard time functioning because of the knee pain and knee dysfunction. She has tried exercising which has not helped. She has had injections in the past which have not helped. She has recently underwent  a left total kne replacement over 7 months ago and is now doing much better from that standpoint. She is now ready to get the right knee fixed at this time. They have been treated  conservatively in the past for the above stated problem and despite conservative measures, they continue to have progressive pain and severe functional limitations and dysfunction. They have failed non-operative management including home exercise, medications, and injections. It is felt that they would benefit from undergoing total joint replacement on the right. Risks and benefits of the procedure have been discussed with the patient and they elect to proceed with surgery. There are no active contraindications to surgery such as ongoing infection or rapidly progressive neurological disease.   ROS Constitutional: Constitutional: no fever, chills, night sweats, or significant weight loss.  Cardiovascular: Cardiovascular: high blood pressure.  Respiratory: Respiratory: no cough or shortness of breath and No COPD.  Gastrointestinal: Gastrointestinal: no vomiting or nausea.  Musculoskeletal: Musculoskeletal: Joint Pain; catching and locking, stiffness, muscle cramps, decreased range of motion.  Neurologic: Neurologic: no numbness, tingling, or difficulty with balance.   Physical Exam Patient is a 62 year old female.  General Mental Status - Alert, cooperative and good historian. General Appearance - pleasant, Not in acute distress. Orientation - Oriented X3. Build & Nutrition - Well nourished and Well developed.  Head and Neck Head - normocephalic, atraumatic . Neck Global Assessment - supple, no bruit auscultated on the right, no bruit auscultated on the left.  Eye Wears Glasses Pupil - Bilateral - PERR Motion - Bilateral - EOMI.  Chest and Lung Exam Auscultation Breath sounds - clear at anterior chest wall and clear at posterior chest wall. Adventitious sounds - No Adventitious sounds.  Cardiovascular Auscultation Rhythm - Regular rate and rhythm. Heart Sounds - S1 WNL and S2 WNL. Murmurs & Other Heart Sounds - Auscultation of the heart reveals - No  Murmurs.  Abdomen Palpation/Percussion Tenderness - Abdomen is non-tender to palpation. Abdomen is soft. Auscultation Auscultation of the abdomen reveals - Bowel sounds normal.  Genitourinary Note: Not done, not pertinent to present illness  Musculoskeletal LEFT knee looks excellent. There is no swelling. Range of motion is 0-95 actively and 105 passively. There is no instability. RIGHT knee shows no effusion. Her range of motion is 15-95. There is marked crepitus on range of motion with tenderness medial greater than lateral and no instability. Radiographs AP and lateral of the LEFT knee show her prosthesis to be in excellent position with no periprosthetic abnormalities. I reviewed previous radiographs of the RIGHT knee from last year and she has severe bone-on-bone arthritis in all 3 compartments with large osteophyte formation.    Assessment / Plan 1. Osteoarthritis of right knee joint M17.11: Unilateral primary osteoarthritis, right knee  2. 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 Ernie Hew IV TXA Anesthesia Issues: Possible issues with Versed Patient was instructed on what medications to stop prior to surgery. - Follow up visit in 2 weeks with Dr. Wynelle Link - Begin physical therapy following surgery. PHYSICAL THERAPY REFERRAL - to setup up first PT Eval on Thursday 10/30/2017 - Pre-operative lab work as pre Pre-Surgical Testing - Prescriptions will be provided in hospital at time of discharge  Return to [place], MD for [facility] on 11/11/2017 at 01:45 PM  Encounter signed-off by Mickel Crow, PA-C

## 2017-10-26 MED ORDER — BUPIVACAINE LIPOSOME 1.3 % IJ SUSP
20.0000 mL | Freq: Once | INTRAMUSCULAR | Status: DC
  Filled 2017-10-26: qty 20

## 2017-10-26 MED ORDER — TRANEXAMIC ACID 1000 MG/10ML IV SOLN
1000.0000 mg | INTRAVENOUS | Status: AC
  Administered 2017-10-27: 1000 mg via INTRAVENOUS
  Filled 2017-10-26: qty 1100

## 2017-10-26 MED ORDER — TRANEXAMIC ACID 1000 MG/10ML IV SOLN
1000.0000 mg | INTRAVENOUS | Status: DC
  Filled 2017-10-26: qty 10

## 2017-10-27 ENCOUNTER — Inpatient Hospital Stay (HOSPITAL_COMMUNITY): Payer: BLUE CROSS/BLUE SHIELD | Admitting: Certified Registered Nurse Anesthetist

## 2017-10-27 ENCOUNTER — Encounter (HOSPITAL_COMMUNITY): Payer: Self-pay | Admitting: *Deleted

## 2017-10-27 ENCOUNTER — Inpatient Hospital Stay (HOSPITAL_COMMUNITY)
Admission: RE | Admit: 2017-10-27 | Discharge: 2017-10-29 | DRG: 470 | Disposition: A | Discharge status: Home / Self Care | Payer: BLUE CROSS/BLUE SHIELD | Source: Ambulatory Visit | Attending: Orthopedic Surgery | Admitting: Orthopedic Surgery

## 2017-10-27 ENCOUNTER — Other Ambulatory Visit: Payer: Self-pay

## 2017-10-27 ENCOUNTER — Encounter (HOSPITAL_COMMUNITY)
Admission: RE | Disposition: A | Discharge status: Home / Self Care | Payer: Self-pay | Source: Ambulatory Visit | Attending: Orthopedic Surgery

## 2017-10-27 DIAGNOSIS — Z818 Family history of other mental and behavioral disorders: Secondary | ICD-10-CM

## 2017-10-27 DIAGNOSIS — Z7901 Long term (current) use of anticoagulants: Secondary | ICD-10-CM

## 2017-10-27 DIAGNOSIS — M1711 Unilateral primary osteoarthritis, right knee: Secondary | ICD-10-CM | POA: Diagnosis not present

## 2017-10-27 DIAGNOSIS — Z807 Family history of other malignant neoplasms of lymphoid, hematopoietic and related tissues: Secondary | ICD-10-CM | POA: Diagnosis not present

## 2017-10-27 DIAGNOSIS — M179 Osteoarthritis of knee, unspecified: Secondary | ICD-10-CM | POA: Diagnosis present

## 2017-10-27 DIAGNOSIS — Z87442 Personal history of urinary calculi: Secondary | ICD-10-CM | POA: Diagnosis not present

## 2017-10-27 DIAGNOSIS — E7800 Pure hypercholesterolemia, unspecified: Secondary | ICD-10-CM | POA: Diagnosis present

## 2017-10-27 DIAGNOSIS — Z823 Family history of stroke: Secondary | ICD-10-CM

## 2017-10-27 DIAGNOSIS — K219 Gastro-esophageal reflux disease without esophagitis: Secondary | ICD-10-CM | POA: Diagnosis present

## 2017-10-27 DIAGNOSIS — Z780 Asymptomatic menopausal state: Secondary | ICD-10-CM

## 2017-10-27 DIAGNOSIS — M1710 Unilateral primary osteoarthritis, unspecified knee: Secondary | ICD-10-CM | POA: Diagnosis present

## 2017-10-27 DIAGNOSIS — Z96652 Presence of left artificial knee joint: Secondary | ICD-10-CM | POA: Diagnosis not present

## 2017-10-27 DIAGNOSIS — E785 Hyperlipidemia, unspecified: Secondary | ICD-10-CM | POA: Diagnosis present

## 2017-10-27 DIAGNOSIS — I10 Essential (primary) hypertension: Secondary | ICD-10-CM | POA: Diagnosis present

## 2017-10-27 DIAGNOSIS — G8918 Other acute postprocedural pain: Secondary | ICD-10-CM | POA: Diagnosis not present

## 2017-10-27 DIAGNOSIS — Z8349 Family history of other endocrine, nutritional and metabolic diseases: Secondary | ICD-10-CM

## 2017-10-27 DIAGNOSIS — Z801 Family history of malignant neoplasm of trachea, bronchus and lung: Secondary | ICD-10-CM | POA: Diagnosis not present

## 2017-10-27 DIAGNOSIS — M25561 Pain in right knee: Secondary | ICD-10-CM | POA: Diagnosis not present

## 2017-10-27 HISTORY — PX: TOTAL KNEE ARTHROPLASTY: SHX125

## 2017-10-27 LAB — TYPE AND SCREEN
ABO/RH(D): O POS
Antibody Screen: NEGATIVE

## 2017-10-27 SURGERY — ARTHROPLASTY, KNEE, TOTAL
Anesthesia: Spinal | Site: Knee | Laterality: Right

## 2017-10-27 MED ORDER — RIVAROXABAN 10 MG PO TABS
10.0000 mg | ORAL_TABLET | Freq: Every day | ORAL | Status: DC
  Administered 2017-10-28 – 2017-10-29 (×2): 10 mg via ORAL
  Filled 2017-10-27 (×2): qty 1

## 2017-10-27 MED ORDER — ONDANSETRON HCL 4 MG/2ML IJ SOLN
INTRAMUSCULAR | Status: AC
  Filled 2017-10-27: qty 2

## 2017-10-27 MED ORDER — FENTANYL CITRATE (PF) 100 MCG/2ML IJ SOLN
50.0000 ug | INTRAMUSCULAR | Status: DC
Start: 2017-10-27 — End: 2017-10-27
  Administered 2017-10-27: 100 ug via INTRAVENOUS
  Filled 2017-10-27: qty 2

## 2017-10-27 MED ORDER — FENTANYL CITRATE (PF) 100 MCG/2ML IJ SOLN
INTRAMUSCULAR | Status: AC
  Filled 2017-10-27: qty 2

## 2017-10-27 MED ORDER — SODIUM CHLORIDE 0.9 % IJ SOLN
INTRAMUSCULAR | Status: AC
  Filled 2017-10-27: qty 50

## 2017-10-27 MED ORDER — BUPIVACAINE IN DEXTROSE 0.75-8.25 % IT SOLN
INTRATHECAL | Status: DC | PRN
  Administered 2017-10-27: 1.8 mL via INTRATHECAL

## 2017-10-27 MED ORDER — GABAPENTIN 300 MG PO CAPS
300.0000 mg | ORAL_CAPSULE | Freq: Once | ORAL | Status: AC
  Administered 2017-10-27: 300 mg via ORAL
  Filled 2017-10-27: qty 1

## 2017-10-27 MED ORDER — DOCUSATE SODIUM 100 MG PO CAPS
100.0000 mg | ORAL_CAPSULE | Freq: Two times a day (BID) | ORAL | Status: DC
  Administered 2017-10-27 – 2017-10-29 (×4): 100 mg via ORAL
  Filled 2017-10-27 (×4): qty 1

## 2017-10-27 MED ORDER — PROMETHAZINE HCL 25 MG/ML IJ SOLN
12.5000 mg | Freq: Four times a day (QID) | INTRAMUSCULAR | Status: DC | PRN
  Filled 2017-10-27: qty 1

## 2017-10-27 MED ORDER — FENTANYL CITRATE (PF) 100 MCG/2ML IJ SOLN
INTRAMUSCULAR | Status: DC | PRN
  Administered 2017-10-27 (×4): 25 ug via INTRAVENOUS

## 2017-10-27 MED ORDER — PHENYLEPHRINE 40 MCG/ML (10ML) SYRINGE FOR IV PUSH (FOR BLOOD PRESSURE SUPPORT)
PREFILLED_SYRINGE | INTRAVENOUS | Status: AC
Start: 2017-10-27 — End: 2017-10-27
  Filled 2017-10-27: qty 10

## 2017-10-27 MED ORDER — HYDROMORPHONE HCL 1 MG/ML IJ SOLN: 0.5000 mg | INTRAMUSCULAR | Status: DC | PRN

## 2017-10-27 MED ORDER — PROMETHAZINE HCL 25 MG/ML IJ SOLN: 6.2500 mg | INTRAMUSCULAR | Status: DC | PRN

## 2017-10-27 MED ORDER — ACETAMINOPHEN 10 MG/ML IV SOLN
1000.0000 mg | Freq: Once | INTRAVENOUS | Status: AC
  Administered 2017-10-27: 1000 mg via INTRAVENOUS
  Filled 2017-10-27: qty 100

## 2017-10-27 MED ORDER — PROPOFOL 10 MG/ML IV BOLUS
INTRAVENOUS | Status: AC
Start: 2017-10-27 — End: 2017-10-27
  Filled 2017-10-27: qty 60

## 2017-10-27 MED ORDER — OXYCODONE HCL 5 MG PO TABS
10.0000 mg | ORAL_TABLET | ORAL | Status: DC | PRN
  Administered 2017-10-28 (×2): 10 mg via ORAL
  Filled 2017-10-27 (×2): qty 2

## 2017-10-27 MED ORDER — HYDROMORPHONE HCL 1 MG/ML IJ SOLN
0.2500 mg | INTRAMUSCULAR | Status: DC | PRN
  Administered 2017-10-27 (×4): 0.5 mg via INTRAVENOUS

## 2017-10-27 MED ORDER — PROPOFOL 10 MG/ML IV BOLUS
INTRAVENOUS | Status: DC | PRN
  Administered 2017-10-27: 20 mg via INTRAVENOUS
  Administered 2017-10-27: 200 mg via INTRAVENOUS
  Administered 2017-10-27 (×3): 20 mg via INTRAVENOUS

## 2017-10-27 MED ORDER — DEXAMETHASONE SODIUM PHOSPHATE 10 MG/ML IJ SOLN
10.0000 mg | Freq: Once | INTRAMUSCULAR | Status: AC
  Administered 2017-10-27: 10 mg via INTRAVENOUS

## 2017-10-27 MED ORDER — HYDROMORPHONE HCL 1 MG/ML IJ SOLN
INTRAMUSCULAR | Status: AC
  Filled 2017-10-27: qty 2

## 2017-10-27 MED ORDER — METOCLOPRAMIDE HCL 5 MG/ML IJ SOLN
5.0000 mg | Freq: Three times a day (TID) | INTRAMUSCULAR | Status: DC | PRN
  Administered 2017-10-27: 10 mg via INTRAVENOUS
  Filled 2017-10-27: qty 2

## 2017-10-27 MED ORDER — SODIUM CHLORIDE 0.9 % IJ SOLN
INTRAMUSCULAR | Status: DC | PRN
  Administered 2017-10-27: 60 mL

## 2017-10-27 MED ORDER — ONDANSETRON HCL 4 MG/2ML IJ SOLN
INTRAMUSCULAR | Status: DC | PRN
  Administered 2017-10-27: 4 mg via INTRAVENOUS

## 2017-10-27 MED ORDER — DIPHENHYDRAMINE HCL 12.5 MG/5ML PO ELIX: 12.5000 mg | ORAL_SOLUTION | ORAL | Status: DC | PRN

## 2017-10-27 MED ORDER — IRBESARTAN 150 MG PO TABS
300.0000 mg | ORAL_TABLET | Freq: Every day | ORAL | Status: DC
  Administered 2017-10-27 – 2017-10-28 (×2): 300 mg via ORAL
  Filled 2017-10-27 (×2): qty 2

## 2017-10-27 MED ORDER — EPHEDRINE 5 MG/ML INJ
INTRAVENOUS | Status: AC
Start: 2017-10-27 — End: 2017-10-27
  Filled 2017-10-27: qty 10

## 2017-10-27 MED ORDER — ONDANSETRON HCL 4 MG/2ML IJ SOLN
4.0000 mg | Freq: Four times a day (QID) | INTRAMUSCULAR | Status: DC | PRN
  Administered 2017-10-27: 4 mg via INTRAVENOUS
  Filled 2017-10-27: qty 2

## 2017-10-27 MED ORDER — STERILE WATER FOR IRRIGATION IR SOLN
Status: DC | PRN
  Administered 2017-10-27: 2000 mL

## 2017-10-27 MED ORDER — BUPIVACAINE LIPOSOME 1.3 % IJ SUSP
INTRAMUSCULAR | Status: DC | PRN
  Administered 2017-10-27: 20 mL

## 2017-10-27 MED ORDER — CHLORHEXIDINE GLUCONATE 4 % EX LIQD: 60.0000 mL | Freq: Once | CUTANEOUS | Status: DC

## 2017-10-27 MED ORDER — OXYCODONE HCL 5 MG PO TABS
5.0000 mg | ORAL_TABLET | ORAL | Status: DC | PRN
  Administered 2017-10-27 – 2017-10-28 (×2): 5 mg via ORAL
  Administered 2017-10-28 – 2017-10-29 (×4): 10 mg via ORAL
  Filled 2017-10-27 (×2): qty 2
  Filled 2017-10-27: qty 1
  Filled 2017-10-27 (×2): qty 2
  Filled 2017-10-27: qty 1

## 2017-10-27 MED ORDER — METOCLOPRAMIDE HCL 5 MG PO TABS: 5.0000 mg | ORAL_TABLET | Freq: Three times a day (TID) | ORAL | Status: DC | PRN

## 2017-10-27 MED ORDER — SODIUM CHLORIDE 0.9 % IR SOLN
Status: DC | PRN
  Administered 2017-10-27: 1000 mL

## 2017-10-27 MED ORDER — TRANEXAMIC ACID 1000 MG/10ML IV SOLN
1000.0000 mg | Freq: Once | INTRAVENOUS | Status: AC
  Administered 2017-10-27: 1000 mg via INTRAVENOUS
  Filled 2017-10-27: qty 1100

## 2017-10-27 MED ORDER — TRAMADOL HCL 50 MG PO TABS: 50.0000 mg | ORAL_TABLET | Freq: Four times a day (QID) | ORAL | Status: DC | PRN

## 2017-10-27 MED ORDER — DEXAMETHASONE SODIUM PHOSPHATE 10 MG/ML IJ SOLN
INTRAMUSCULAR | Status: AC
  Filled 2017-10-27: qty 1

## 2017-10-27 MED ORDER — PHENOL 1.4 % MT LIQD
1.0000 | OROMUCOSAL | Status: DC | PRN
  Filled 2017-10-27: qty 177

## 2017-10-27 MED ORDER — METHOCARBAMOL 500 MG PO TABS
500.0000 mg | ORAL_TABLET | Freq: Four times a day (QID) | ORAL | Status: DC | PRN
  Administered 2017-10-27 – 2017-10-29 (×4): 500 mg via ORAL
  Filled 2017-10-27 (×4): qty 1

## 2017-10-27 MED ORDER — POLYETHYLENE GLYCOL 3350 17 G PO PACK: 17.0000 g | PACK | Freq: Every day | ORAL | Status: DC | PRN

## 2017-10-27 MED ORDER — CEFAZOLIN SODIUM-DEXTROSE 2-4 GM/100ML-% IV SOLN
2.0000 g | INTRAVENOUS | Status: AC
  Administered 2017-10-27: 2 g via INTRAVENOUS
  Filled 2017-10-27: qty 100

## 2017-10-27 MED ORDER — BISACODYL 10 MG RE SUPP: 10.0000 mg | Freq: Every day | RECTAL | Status: DC | PRN

## 2017-10-27 MED ORDER — SODIUM CHLORIDE 0.9 % IJ SOLN
INTRAMUSCULAR | Status: AC
Start: 2017-10-27 — End: 2017-10-27
  Filled 2017-10-27: qty 10

## 2017-10-27 MED ORDER — PROPOFOL 10 MG/ML IV BOLUS
INTRAVENOUS | Status: AC
  Filled 2017-10-27: qty 20

## 2017-10-27 MED ORDER — METHOCARBAMOL 1000 MG/10ML IJ SOLN
500.0000 mg | Freq: Four times a day (QID) | INTRAVENOUS | Status: DC | PRN
  Filled 2017-10-27: qty 5

## 2017-10-27 MED ORDER — GABAPENTIN 300 MG PO CAPS
300.0000 mg | ORAL_CAPSULE | Freq: Three times a day (TID) | ORAL | Status: DC
  Administered 2017-10-27 – 2017-10-29 (×6): 300 mg via ORAL
  Filled 2017-10-27 (×6): qty 1

## 2017-10-27 MED ORDER — ROPIVACAINE HCL 7.5 MG/ML IJ SOLN
INTRAMUSCULAR | Status: DC | PRN
  Administered 2017-10-27: 20 mL via PERINEURAL

## 2017-10-27 MED ORDER — MENTHOL 3 MG MT LOZG: 1.0000 | LOZENGE | OROMUCOSAL | Status: DC | PRN

## 2017-10-27 MED ORDER — CEFAZOLIN SODIUM-DEXTROSE 1-4 GM/50ML-% IV SOLN
1.0000 g | Freq: Four times a day (QID) | INTRAVENOUS | Status: AC
  Administered 2017-10-27 – 2017-10-28 (×2): 1 g via INTRAVENOUS
  Filled 2017-10-27 (×3): qty 50

## 2017-10-27 MED ORDER — ROSUVASTATIN CALCIUM 5 MG PO TABS
5.0000 mg | ORAL_TABLET | Freq: Every day | ORAL | Status: DC
  Administered 2017-10-27 – 2017-10-28 (×2): 5 mg via ORAL
  Filled 2017-10-27 (×2): qty 1

## 2017-10-27 MED ORDER — FLEET ENEMA 7-19 GM/118ML RE ENEM: 1.0000 | ENEMA | Freq: Once | RECTAL | Status: DC | PRN

## 2017-10-27 MED ORDER — SODIUM CHLORIDE 0.9 % IV SOLN
INTRAVENOUS | Status: DC
  Administered 2017-10-27: 16:00:00 via INTRAVENOUS

## 2017-10-27 MED ORDER — LACTATED RINGERS IV SOLN
INTRAVENOUS | Status: DC
  Administered 2017-10-27 (×3): via INTRAVENOUS

## 2017-10-27 MED ORDER — DEXAMETHASONE SODIUM PHOSPHATE 10 MG/ML IJ SOLN
10.0000 mg | Freq: Once | INTRAMUSCULAR | Status: AC
  Administered 2017-10-28: 10 mg via INTRAVENOUS
  Filled 2017-10-27: qty 1

## 2017-10-27 MED ORDER — ACETAMINOPHEN 500 MG PO TABS
1000.0000 mg | ORAL_TABLET | Freq: Four times a day (QID) | ORAL | Status: AC
  Administered 2017-10-28 (×3): 1000 mg via ORAL
  Filled 2017-10-27 (×4): qty 2

## 2017-10-27 MED ORDER — ONDANSETRON HCL 4 MG PO TABS
4.0000 mg | ORAL_TABLET | Freq: Four times a day (QID) | ORAL | Status: DC | PRN
  Administered 2017-10-29: 4 mg via ORAL
  Filled 2017-10-27: qty 1

## 2017-10-27 SURGICAL SUPPLY — 50 items
BAG DECANTER FOR FLEXI CONT (MISCELLANEOUS) ×2 IMPLANT
BAG ZIPLOCK 12X15 (MISCELLANEOUS) ×2 IMPLANT
BANDAGE ACE 6X5 VEL STRL LF (GAUZE/BANDAGES/DRESSINGS) ×2 IMPLANT
BLADE SAG 18X100X1.27 (BLADE) ×2 IMPLANT
BLADE SAW SGTL 11.0X1.19X90.0M (BLADE) ×2 IMPLANT
BOWL SMART MIX CTS (DISPOSABLE) ×2 IMPLANT
CAP KNEE TOTAL 3 ×2 IMPLANT
CEMENT HV SMART SET (Cement) ×4 IMPLANT
COVER SURGICAL LIGHT HANDLE (MISCELLANEOUS) ×2 IMPLANT
CUFF TOURN SGL QUICK 34 (TOURNIQUET CUFF) ×1
CUFF TRNQT CYL 34X4X40X1 (TOURNIQUET CUFF) ×1 IMPLANT
DECANTER SPIKE VIAL GLASS SM (MISCELLANEOUS) ×2 IMPLANT
DRAPE TOP 10253 STERILE (DRAPES) IMPLANT
DRAPE U-SHAPE 47X51 STRL (DRAPES) ×2 IMPLANT
DRSG ADAPTIC 3X8 NADH LF (GAUZE/BANDAGES/DRESSINGS) ×2 IMPLANT
DRSG PAD ABDOMINAL 8X10 ST (GAUZE/BANDAGES/DRESSINGS) ×2 IMPLANT
DURAPREP 26ML APPLICATOR (WOUND CARE) ×2 IMPLANT
ELECT REM PT RETURN 15FT ADLT (MISCELLANEOUS) ×2 IMPLANT
EVACUATOR 1/8 PVC DRAIN (DRAIN) ×2 IMPLANT
GAUZE SPONGE 4X4 12PLY STRL (GAUZE/BANDAGES/DRESSINGS) ×2 IMPLANT
GLOVE BIO SURGEON STRL SZ7.5 (GLOVE) IMPLANT
GLOVE BIO SURGEON STRL SZ8 (GLOVE) ×2 IMPLANT
GLOVE BIOGEL PI IND STRL 6.5 (GLOVE) IMPLANT
GLOVE BIOGEL PI IND STRL 8 (GLOVE) ×1 IMPLANT
GLOVE BIOGEL PI INDICATOR 6.5 (GLOVE)
GLOVE BIOGEL PI INDICATOR 8 (GLOVE) ×1
GLOVE SURG SS PI 6.5 STRL IVOR (GLOVE) IMPLANT
GOWN STRL REUS W/TWL LRG LVL3 (GOWN DISPOSABLE) ×2 IMPLANT
GOWN STRL REUS W/TWL XL LVL3 (GOWN DISPOSABLE) IMPLANT
HANDPIECE INTERPULSE COAX TIP (DISPOSABLE) ×1
IMMOBILIZER KNEE 20 (SOFTGOODS) ×2
IMMOBILIZER KNEE 20 THIGH 36 (SOFTGOODS) ×1 IMPLANT
MANIFOLD NEPTUNE II (INSTRUMENTS) ×2 IMPLANT
NS IRRIG 1000ML POUR BTL (IV SOLUTION) ×2 IMPLANT
PACK TOTAL KNEE CUSTOM (KITS) ×2 IMPLANT
PAD ABD 8X10 STRL (GAUZE/BANDAGES/DRESSINGS) ×2 IMPLANT
PADDING CAST COTTON 6X4 STRL (CAST SUPPLIES) ×2 IMPLANT
POSITIONER SURGICAL ARM (MISCELLANEOUS) ×2 IMPLANT
SET HNDPC FAN SPRY TIP SCT (DISPOSABLE) ×1 IMPLANT
STRIP CLOSURE SKIN 1/2X4 (GAUZE/BANDAGES/DRESSINGS) ×2 IMPLANT
SUT MNCRL AB 4-0 PS2 18 (SUTURE) ×2 IMPLANT
SUT STRATAFIX 0 PDS 27 VIOLET (SUTURE) ×2
SUT VIC AB 2-0 CT1 27 (SUTURE) ×3
SUT VIC AB 2-0 CT1 TAPERPNT 27 (SUTURE) ×3 IMPLANT
SUTURE STRATFX 0 PDS 27 VIOLET (SUTURE) ×1 IMPLANT
SYR 30ML LL (SYRINGE) ×4 IMPLANT
TRAY FOLEY W/METER SILVER 16FR (SET/KITS/TRAYS/PACK) ×2 IMPLANT
WATER STERILE IRR 1000ML POUR (IV SOLUTION) ×4 IMPLANT
WRAP KNEE MAXI GEL POST OP (GAUZE/BANDAGES/DRESSINGS) ×2 IMPLANT
YANKAUER SUCT BULB TIP 10FT TU (MISCELLANEOUS) ×2 IMPLANT

## 2017-10-27 NOTE — Anesthesia Preprocedure Evaluation (Signed)
Anesthesia Evaluation  Patient identified by MRN, date of birth, ID band Patient awake    Reviewed: Allergy & Precautions, NPO status , Patient's Chart, lab work & pertinent test results  Airway Mallampati: II  TM Distance: >3 FB Neck ROM: Full    Dental no notable dental hx.    Pulmonary neg pulmonary ROS,    Pulmonary exam normal breath sounds clear to auscultation       Cardiovascular hypertension, Normal cardiovascular exam Rhythm:Regular Rate:Normal     Neuro/Psych negative neurological ROS  negative psych ROS   GI/Hepatic negative GI ROS, Neg liver ROS,   Endo/Other  negative endocrine ROS  Renal/GU negative Renal ROS  negative genitourinary   Musculoskeletal  (+) Arthritis , Osteoarthritis,    Abdominal   Peds negative pediatric ROS (+)  Hematology negative hematology ROS (+)   Anesthesia Other Findings   Reproductive/Obstetrics negative OB ROS                             Anesthesia Physical Anesthesia Plan  ASA: II  Anesthesia Plan: Spinal   Post-op Pain Management:    Induction: Intravenous  PONV Risk Score and Plan: 2 and Ondansetron and Dexamethasone  Airway Management Planned: Simple Face Mask  Additional Equipment:   Intra-op Plan:   Post-operative Plan:   Informed Consent: I have reviewed the patients History and Physical, chart, labs and discussed the procedure including the risks, benefits and alternatives for the proposed anesthesia with the patient or authorized representative who has indicated his/her understanding and acceptance.   Dental advisory given  Plan Discussed with: CRNA and Surgeon  Anesthesia Plan Comments:         Anesthesia Quick Evaluation

## 2017-10-27 NOTE — Progress Notes (Signed)
Assisted Dr. Rose with right, ultrasound guided, adductor canal block. Side rails up, monitors on throughout procedure. See vital signs in flow sheet. Tolerated Procedure well.  

## 2017-10-27 NOTE — Anesthesia Postprocedure Evaluation (Signed)
Anesthesia Post Note  Patient: Deniece PortelaMelinda Barman  Procedure(s) Performed: RIGHT TOTAL KNEE ARTHROPLASTY (Right Knee)     Patient location during evaluation: PACU Anesthesia Type: General Level of consciousness: awake and alert Pain management: pain level controlled Vital Signs Assessment: post-procedure vital signs reviewed and stable Respiratory status: spontaneous breathing, nonlabored ventilation, respiratory function stable and patient connected to nasal cannula oxygen Cardiovascular status: blood pressure returned to baseline and stable Postop Assessment: no apparent nausea or vomiting Anesthetic complications: no    Last Vitals:  Vitals:   10/27/17 1501 10/27/17 1605  BP: 134/79 123/72  Pulse: 62 (!) 59  Resp: 12 12  Temp: 36.4 C 36.4 C  SpO2: 100% 98%    Last Pain:  Vitals:   10/27/17 1605  TempSrc: Oral  PainSc:                  Daniil Labarge S

## 2017-10-27 NOTE — Interval H&P Note (Signed)
History and Physical Interval Note:  10/27/2017 9:24 AM  Heather Lambert  has presented today for surgery, with the diagnosis of Osteoarthritis Right Knee  The various methods of treatment have been discussed with the patient and family. After consideration of risks, benefits and other options for treatment, the patient has consented to  Procedure(s): RIGHT TOTAL KNEE ARTHROPLASTY (Right) as a surgical intervention .  The patient's history has been reviewed, patient examined, no change in status, stable for surgery.  I have reviewed the patient's chart and labs.  Questions were answered to the patient's satisfaction.     Heather Lambert

## 2017-10-27 NOTE — Anesthesia Procedure Notes (Signed)
Anesthesia Procedure Image    

## 2017-10-27 NOTE — Anesthesia Procedure Notes (Signed)
Anesthesia Regional Block: Adductor canal block   Pre-Anesthetic Checklist: ,, timeout performed, Correct Patient, Correct Site, Correct Laterality, Correct Procedure, Correct Position, site marked, Risks and benefits discussed,  Surgical consent,  Pre-op evaluation,  At surgeon's request and post-op pain management  Laterality: Right  Prep: chloraprep       Needles:  Injection technique: Single-shot  Needle Type: Echogenic Needle     Needle Length: 9cm      Additional Needles:   Procedures:,,,, ultrasound used (permanent image in chart),,,,  Narrative:  Start time: 10/27/2017 10:43 AM End time: 10/27/2017 10:51 AM Injection made incrementally with aspirations every 5 mL.  Performed by: Personally  Anesthesiologist: Eilene Ghaziose, Coolidge Gossard, MD  Additional Notes: Patient tolerated the procedure well without complications

## 2017-10-27 NOTE — Discharge Instructions (Addendum)
° °Dr. Frank Aluisio °Total Joint Specialist °[facility] °[address] °[place], [place] [address] °[phone] ° °TOTAL KNEE REPLACEMENT POSTOPERATIVE DIRECTIONS ° °Knee Rehabilitation, Guidelines Following Surgery  °Results after knee surgery are often greatly improved when you follow the exercise, range of motion and muscle strengthening exercises prescribed by your doctor. Safety measures are also important to protect the knee from further injury. Any time any of these exercises cause you to have increased pain or swelling in your knee joint, decrease the amount until you are comfortable again and slowly increase them. If you have problems or questions, call your caregiver or physical therapist for advice.  ° °HOME CARE INSTRUCTIONS  °Remove items at home which could result in a fall. This includes throw rugs or furniture in walking pathways.  °· ICE to the affected knee every three hours for 30 minutes at a time and then as needed for pain and swelling.  Continue to use ice on the knee for pain and swelling from surgery. You may notice swelling that will progress down to the foot and ankle.  This is normal after surgery.  Elevate the leg when you are not up walking on it.   °· Continue to use the breathing machine which will help keep your temperature down.  It is common for your temperature to cycle up and down following surgery, especially at night when you are not up moving around and exerting yourself.  The breathing machine keeps your lungs expanded and your temperature down. °· Do not place pillow under knee, focus on keeping the knee straight while resting ° °DIET °You may resume your previous home diet once your are discharged from the hospital. ° °DRESSING / WOUND CARE / SHOWERING °You may shower 3 days after surgery, but keep the wounds dry during showering.  You may use an occlusive plastic wrap (Press'n Seal for example), NO SOAKING/SUBMERGING IN THE BATHTUB.  If the bandage gets  wet, change with a clean dry gauze.  If the incision gets wet, pat the wound dry with a clean towel. °You may start showering once you are discharged home but do not submerge the incision under water. Just pat the incision dry and apply a dry gauze dressing on daily. °Change the surgical dressing daily and reapply a dry dressing each time. ° °ACTIVITY °Walk with your walker as instructed. °Use walker as long as suggested by your caregivers. °Avoid periods of inactivity such as sitting longer than an hour when not asleep. This helps prevent blood clots.  °You may resume a sexual relationship in one month or when given the OK by your doctor.  °You may return to work once you are cleared by your doctor.  °Do not drive a car for 6 weeks or until released by you surgeon.  °Do not drive while taking narcotics. ° °WEIGHT BEARING °Weight bearing as tolerated with assist device (walker, cane, etc) as directed, use it as long as suggested by your surgeon or therapist, typically at least 4-6 weeks. ° °POSTOPERATIVE CONSTIPATION PROTOCOL °Constipation - defined medically as fewer than three stools per week and severe constipation as less than one stool per week. ° °One of the most common issues patients have following surgery is constipation.  Even if you have a regular bowel pattern at home, your normal regimen is likely to be disrupted due to multiple reasons following surgery.  Combination of anesthesia, postoperative narcotics, change in appetite and fluid intake all can affect your bowels.    In order to avoid complications following surgery, here are some recommendations in order to help you during your recovery period. ° °Colace (docusate) - Pick up an over-the-counter form of Colace or another stool softener and take twice a day as long as you are requiring postoperative pain medications.  Take with a full glass of water daily.  If you experience loose stools or diarrhea, hold the colace until you stool forms back up.  If  your symptoms do not get better within 1 week or if they get worse, check with your doctor. ° °Dulcolax (bisacodyl) - Pick up over-the-counter and take as directed by the product packaging as needed to assist with the movement of your bowels.  Take with a full glass of water.  Use this product as needed if not relieved by Colace only.  ° °MiraLax (polyethylene glycol) - Pick up over-the-counter to have on hand.  MiraLax is a solution that will increase the amount of water in your bowels to assist with bowel movements.  Take as directed and can mix with a glass of water, juice, soda, coffee, or tea.  Take if you go more than two days without a movement. °Do not use MiraLax more than once per day. Call your doctor if you are still constipated or irregular after using this medication for 7 days in a row. ° °If you continue to have problems with postoperative constipation, please contact the office for further assistance and recommendations.  If you experience "the worst abdominal pain ever" or develop nausea or vomiting, please contact the office immediatly for further recommendations for treatment. ° °ITCHING ° If you experience itching with your medications, try taking only a single pain pill, or even half a pain pill at a time.  You can also use Benadryl over the counter for itching or also to help with sleep.  ° °TED HOSE STOCKINGS °Wear the elastic stockings on both legs for three weeks following surgery during the day but you may remove then at night for sleeping. ° °MEDICATIONS °See your medication summary on the “After Visit Summary” that the nursing staff will review with you prior to discharge.  You may have some home medications which will be placed on hold until you complete the course of blood thinner medication.  It is important for you to complete the blood thinner medication as prescribed by your surgeon.  Continue your approved medications as instructed at time of discharge. ° °PRECAUTIONS °If you  experience chest pain or shortness of breath - call 911 immediately for transfer to the hospital emergency department.  °If you develop a fever greater that 101 F, purulent drainage from wound, increased redness or drainage from wound, foul odor from the wound/dressing, or calf pain - CONTACT YOUR SURGEON.   °                                                °FOLLOW-UP APPOINTMENTS °Make sure you keep all of your appointments after your operation with your surgeon and caregivers. You should call the office at the above phone number and make an appointment for approximately two weeks after the date of your surgery or on the date instructed by your surgeon outlined in the "After Visit Summary". ° ° °RANGE OF MOTION AND STRENGTHENING EXERCISES  °Rehabilitation of the knee is important following a knee injury or   an operation. After just a few days of immobilization, the muscles of the thigh which control the knee become weakened and shrink (atrophy). Knee exercises are designed to build up the tone and strength of the thigh muscles and to improve knee motion. Often times heat used for twenty to thirty minutes before working out will loosen up your tissues and help with improving the range of motion but do not use heat for the first two weeks following surgery. These exercises can be done on a training (exercise) mat, on the floor, on a table or on a bed. Use what ever works the best and is most comfortable for you Knee exercises include:  °Leg Lifts - While your knee is still immobilized in a splint or cast, you can do straight leg raises. Lift the leg to 60 degrees, hold for 3 sec, and slowly lower the leg. Repeat 10-20 times 2-3 times daily. Perform this exercise against resistance later as your knee gets better.  °Quad and Hamstring Sets - Tighten up the muscle on the front of the thigh (Quad) and hold for 5-10 sec. Repeat this 10-20 times hourly. Hamstring sets are done by pushing the foot backward against an object  and holding for 5-10 sec. Repeat as with quad sets.  °· Leg Slides: Lying on your back, slowly slide your foot toward your buttocks, bending your knee up off the floor (only go as far as is comfortable). Then slowly slide your foot back down until your leg is flat on the floor again. °· Angel Wings: Lying on your back spread your legs to the side as far apart as you can without causing discomfort.  °A rehabilitation program following serious knee injuries can speed recovery and prevent re-injury in the future due to weakened muscles. Contact your doctor or a physical therapist for more information on knee rehabilitation.  ° °IF YOU ARE TRANSFERRED TO A SKILLED REHAB FACILITY °If the patient is transferred to a skilled rehab facility following release from the hospital, a list of the current medications will be sent to the facility for the patient to continue.  When discharged from the skilled rehab facility, please have the facility set up the patient's Home Health Physical Therapy prior to being released. Also, the skilled facility will be responsible for providing the patient with their medications at time of release from the facility to include their pain medication, the muscle relaxants, and their blood thinner medication. If the patient is still at the rehab facility at time of the two week follow up appointment, the skilled rehab facility will also need to assist the patient in arranging follow up appointment in our office and any transportation needs. ° °MAKE SURE YOU:  °Understand these instructions.  °Get help right away if you are not doing well or get worse.  ° ° °Pick up stool softner and laxative for home use following surgery while on pain medications. °Do not submerge incision under water. °Please use good hand washing techniques while changing dressing each day. °May shower starting three days after surgery. °Please use a clean towel to pat the incision dry following showers. °Continue to use ice for  pain and swelling after surgery. °Do not use any lotions or creams on the incision until instructed by your surgeon. ° °Take Xarelto for two and a half more weeks following discharge from the hospital, then discontinue Xarelto. °Once the patient has completed the blood thinner regimen, then take a Baby 81 mg Aspirin daily for three   more weeks. ° ° ° °Information on my medicine - XARELTO® (Rivaroxaban) ° °Why was Xarelto® prescribed for you? °Xarelto® was prescribed for you to reduce the risk of blood clots forming after orthopedic surgery. The medical term for these abnormal blood clots is venous thromboembolism (VTE). ° °What do you need to know about xarelto® ? °Take your Xarelto® ONCE DAILY at the same time every day. °You may take it either with or without food. ° °If you have difficulty swallowing the tablet whole, you may crush it and mix in applesauce just prior to taking your dose. ° °Take Xarelto® exactly as prescribed by your doctor and DO NOT stop taking Xarelto® without talking to the doctor who prescribed the medication.  Stopping without other VTE prevention medication to take the place of Xarelto® may increase your risk of developing a clot. ° °After discharge, you should have regular check-up appointments with your healthcare provider that is prescribing your Xarelto®.   ° °What do you do if you miss a dose? °If you miss a dose, take it as soon as you remember on the same day then continue your regularly scheduled once daily regimen the next day. Do not take two doses of Xarelto® on the same day.  ° °Important Safety Information °A possible side effect of Xarelto® is bleeding. You should call your healthcare provider right away if you experience any of the following: °? Bleeding from an injury or your nose that does not stop. °? Unusual colored urine (red or dark brown) or unusual colored stools (red or black). °? Unusual bruising for unknown reasons. °? A serious fall or if you hit your head (even  if there is no bleeding). ° °Some medicines may interact with Xarelto® and might increase your risk of bleeding while on Xarelto®. To help avoid this, consult your healthcare provider or pharmacist prior to using any new prescription or non-prescription medications, including herbals, vitamins, non-steroidal anti-inflammatory drugs (NSAIDs) and supplements. ° °This website has more information on Xarelto®: www.xarelto.com. ° ° ° °

## 2017-10-27 NOTE — Anesthesia Procedure Notes (Deleted)
Spinal  Patient location during procedure: OR Start time: 10/27/2017 11:18 AM End time: 10/27/2017 11:27 AM Staffing Anesthesiologist: Eilene Ghaziose, George, MD Resident/CRNA: Epimenio SarinJarvela, Aalaya Yadao R, CRNA Performed: anesthesiologist and resident/CRNA  Preanesthetic Checklist Completed: patient identified, surgical consent, pre-op evaluation, timeout performed, IV checked, risks and benefits discussed and monitors and equipment checked Spinal Block Patient position: sitting Prep: DuraPrep Patient monitoring: heart rate, cardiac monitor, continuous pulse ox and blood pressure Approach: midline Location: L3-4 Needle Needle type: Quincke  Needle gauge: 22 G Needle length: 9 cm Needle insertion depth: 8 cm Additional Notes Attempt by CRNA Pencan 24g, unable to obtain CSF. Attempt by Dr Jacelyn Piose Pencan 24g unsuccessful followed by Quincke 22g with positive CSF return pre and post injection. Negative for complete loss of sensation or motor and converted to general with LMA>

## 2017-10-27 NOTE — Anesthesia Procedure Notes (Signed)
Spinal  Patient location during procedure: OR Start time: 10/27/2017 11:50 AM End time: 10/27/2017 12:00 PM Staffing Anesthesiologist: Myrtie Soman, MD Performed: anesthesiologist  Preanesthetic Checklist Completed: patient identified, site marked, surgical consent, pre-op evaluation, timeout performed, IV checked, risks and benefits discussed and monitors and equipment checked Spinal Block Patient position: sitting Prep: Betadine Patient monitoring: heart rate, continuous pulse ox and blood pressure Injection technique: single-shot Needle Needle type: Spinocan  Needle gauge: 22 G Needle length: 9 cm Assessment Events: failed spinal Additional Notes Expiration date of kit checked and confirmed. Patient tolerated procedure well, without complications.  SAB attempt x 2 by crna.  I obtained good csf flow at L4-5, placed patient in RLD for 5 minutes post injection

## 2017-10-27 NOTE — Anesthesia Procedure Notes (Signed)
Procedure Name: LMA Insertion Date/Time: 10/27/2017 11:50 AM Performed by: Orest DikesPeters, Amaka Gluth J, CRNA Pre-anesthesia Checklist: Patient identified, Emergency Drugs available, Suction available and Patient being monitored Patient Re-evaluated:Patient Re-evaluated prior to induction Oxygen Delivery Method: Circle system utilized Preoxygenation: Pre-oxygenation with 100% oxygen Induction Type: IV induction LMA: LMA inserted LMA Size: 4.0 Number of attempts: 1 Placement Confirmation: positive ETCO2 and breath sounds checked- equal and bilateral Tube secured with: Tape Dental Injury: Teeth and Oropharynx as per pre-operative assessment

## 2017-10-27 NOTE — Transfer of Care (Signed)
Immediate Anesthesia Transfer of Care Note  Patient: Heather Lambert  Procedure(s) Performed: RIGHT TOTAL KNEE ARTHROPLASTY (Right Knee)  Patient Location: PACU  Anesthesia Type:General and Regional  Level of Consciousness: sedated  Airway & Oxygen Therapy: Patient Spontanous Breathing and Patient connected to face mask oxygen  Post-op Assessment: Report given to RN and Post -op Vital signs reviewed and stable  Post vital signs: Reviewed and stable  Last Vitals:  Vitals Value Taken Time  BP    Temp    Pulse 69 10/27/2017  1:03 PM  Resp    SpO2 99 % 10/27/2017  1:03 PM  Vitals shown include unvalidated device data.  Last Pain:  Vitals:   10/27/17 0923  TempSrc:   PainSc: 7          Complications: No apparent anesthesia complications

## 2017-10-27 NOTE — Progress Notes (Signed)
PT Cancellation Note  Patient Details Name: Deniece PortelaMelinda Brine MRN: [id number] DOB: 20-Nov-1955   Cancelled Treatment:    Reason Eval/Treat Not Completed: Medical issues which prohibited therapyexperiencing N/V since surgery.   Rada HayHill, Xander Jutras [name] 10/27/2017, 5:40 PM  Blanchard KelchKaren Zaela Graley PT [phone]

## 2017-10-27 NOTE — Op Note (Signed)
OPERATIVE REPORT-TOTAL KNEE ARTHROPLASTY   Pre-operative diagnosis- Osteoarthritis  Right knee(s)  Post-operative diagnosis- Osteoarthritis Right knee(s)  Procedure-  Right  Total Knee Arthroplasty  (Aesculap titanium knee)  Surgeon- Heather RankinFrank V. Eliceo Gladu, MD  Assistant- Dimitri PedAmber Constable, PA-C   Anesthesia-  General and adductor canal block  EBL- 25 ml   Drains Hemovac  Tourniquet time- 38 minutes @ 300 mm Hg  Complications- None  Condition-PACU - hemodynamically stable.   Brief Clinical Note  Heather Lambert is a 62 y.o. year old female with end stage OA of her right knee with progressively worsening pain and dysfunction. She has constant pain, with activity and at rest and significant functional deficits with difficulties even with ADLs. She has had extensive non-op management including analgesics, injections of cortisone and viscosupplements, and home exercise program, but remains in significant pain with significant dysfunction.Radiographs show bone on bone arthritis medial and patellofemoral. She presents now for right Total Knee Arthroplasty.    Procedure in detail---   The patient is brought into the operating room and positioned supine on the operating table. After successful administration of  GA combined with regional for post-op pain,   a tourniquet is placed high on the  Right thigh(s) and the lower extremity is prepped and draped in the usual sterile fashion. Time out is performed by the operating team and then the  Right lower extremity is wrapped in Esmarch, knee flexed and the tourniquet inflated to 300 mmHg.       A midline incision is made with a ten blade through the subcutaneous tissue to the level of the extensor mechanism. A fresh blade is used to make a medial parapatellar arthrotomy. Soft tissue over the proximal medial tibia is subperiosteally elevated to the joint line with a knife and into the semimembranosus bursa with a Cobb elevator. Soft tissue over the  proximal lateral tibia is elevated with attention being paid to avoiding the patellar tendon on the tibial tubercle. The patella is everted, knee flexed 90 degrees and the ACL and PCL are removed. Findings are bone on bone all 3 compartments with massive global osteophytes.        The drill is used to create a starting hole in the distal femur and the canal is thoroughly irrigated with sterile saline to remove the fatty contents. The 5 degree Right  valgus alignment guide is placed into the femoral canal and the distal femoral cutting block is pinned to remove 10 mm off the distal femur. Resection is made with an oscillating saw.      The tibia is subluxed forward and the menisci are removed. The extramedullary alignment guide is placed referencing proximally at the medial aspect of the tibial tubercle and distally along the second metatarsal axis and tibial crest. The block is pinned to remove 2mm off the more deficient medial  side. Resection is made with an oscillating saw. Size 1is the most appropriate size for the tibia and the proximal tibia is prepared with the modular drill and keel punch for that size.      The femoral sizing guide is placed and size 4 is most appropriate. Rotation is marked off the epicondylar axis and confirmed by creating a rectangular flexion gap at 90 degrees. The size 4 cutting block is pinned in this rotation and the anterior, posterior and chamfer cuts are made with the oscillating saw. The intercondylar block is then placed and that cut is made.      Trial size 1 tibial  component, trial size 4 narrow posterior stabilized femur and a 14  mm posterior stabilized fixed bearing insert trial is placed. Full extension is achieved with excellent varus/valgus and anterior/posterior balance throughout full range of motion. The patella is everted and thickness measured to be 22  mm. Free hand resection is taken to 12 mm, a 3 template is placed, lug holes are drilled, trial patella is  placed, and it tracks normally. Osteophytes are removed off the posterior femur with the trial in place. All trials are removed and the cut bone surfaces prepared with pulsatile lavage. Cement is mixed and once ready for implantation, the size 1 tibial implant, size  4 narrow posterior stabilized femoral component, and the size 3 patella are cemented in place and the patella is held with the clamp. The trial insert is placed and the knee held in full extension. The Exparel (20 ml mixed with 60 ml saline) is injected into the extensor mechanism, posterior capsule, medial and lateral gutters and subcutaneous tissues.  All extruded cement is removed and once the cement is hard the permanent 14 mm posterior stabilized fixed bearing insert is placed into the tibial tray.      The wound is copiously irrigated with saline solution and the extensor mechanism closed over a hemovac drain with #1 V-loc suture. The tourniquet is released for a total tourniquet time of 38  minutes. Flexion against gravity is 140 degrees and the patella tracks normally. Subcutaneous tissue is closed with 2.0 vicryl and subcuticular with running 4.0 Monocryl. The incision is cleaned and dried and steri-strips and a bulky sterile dressing are applied. The limb is placed into a knee immobilizer and the patient is awakened and transported to recovery in stable condition.      Please note that a surgical assistant was a medical necessity for this procedure in order to perform it in a safe and expeditious manner. Surgical assistant was necessary to retract the ligaments and vital neurovascular structures to prevent injury to them and also necessary for proper positioning of the limb to allow for anatomic placement of the prosthesis.   Heather Rankin Alane Hanssen, MD    10/27/2017, 12:39 PM

## 2017-10-28 LAB — CBC
HEMATOCRIT: 37.3 % (ref 36.0–46.0)
HEMOGLOBIN: 12.1 g/dL (ref 12.0–15.0)
MCH: 29.4 pg (ref 26.0–34.0)
MCHC: 32.4 g/dL (ref 30.0–36.0)
MCV: 90.8 fL (ref 78.0–100.0)
Platelets: 232 10*3/uL (ref 150–400)
RBC: 4.11 MIL/uL (ref 3.87–5.11)
RDW: 12.8 % (ref 11.5–15.5)
WBC: 10.2 10*3/uL (ref 4.0–10.5)

## 2017-10-28 LAB — BASIC METABOLIC PANEL
ANION GAP: 7 (ref 5–15)
BUN: 12 mg/dL (ref 6–20)
CHLORIDE: 108 mmol/L (ref 101–111)
CO2: 26 mmol/L (ref 22–32)
Calcium: 8.5 mg/dL — ABNORMAL LOW (ref 8.9–10.3)
Creatinine, Ser: 0.69 mg/dL (ref 0.44–1.00)
GFR calc Af Amer: >60 mL/min (ref >60)
GLUCOSE: 229 mg/dL — AB (ref 65–99)
Potassium: 4.1 mmol/L (ref 3.5–5.1)
Sodium: 141 mmol/L (ref 135–145)

## 2017-10-28 MED ORDER — OXYCODONE HCL 5 MG PO TABS: 5.0000 mg | ORAL_TABLET | ORAL | 0 refills | Status: DC | PRN

## 2017-10-28 MED ORDER — METHOCARBAMOL 500 MG PO TABS: 500.0000 mg | ORAL_TABLET | Freq: Four times a day (QID) | ORAL | 0 refills | Status: DC | PRN

## 2017-10-28 MED ORDER — TRAMADOL HCL 50 MG PO TABS: 50.0000 mg | ORAL_TABLET | Freq: Four times a day (QID) | ORAL | 0 refills | Status: DC | PRN

## 2017-10-28 MED ORDER — TRAMADOL HCL 50 MG PO TABS: 50.0000 mg | ORAL_TABLET | Freq: Four times a day (QID) | ORAL | Status: DC | PRN

## 2017-10-28 MED ORDER — SODIUM CHLORIDE 0.9 % IV BOLUS
250.0000 mL | Freq: Once | INTRAVENOUS | Status: AC
  Administered 2017-10-28: 250 mL via INTRAVENOUS

## 2017-10-28 MED ORDER — RIVAROXABAN 10 MG PO TABS: 10.0000 mg | ORAL_TABLET | Freq: Every day | ORAL | 0 refills | Status: DC

## 2017-10-28 MED ORDER — GABAPENTIN 300 MG PO CAPS: 300.0000 mg | ORAL_CAPSULE | Freq: Three times a day (TID) | ORAL | 0 refills | Status: DC

## 2017-10-28 NOTE — Progress Notes (Signed)
Spoke with patient at bedside. Confirmed plan for OP PT, already arranged. Has RW and 3n1. [phone] 

## 2017-10-28 NOTE — Discharge Summary (Signed)
Physician Discharge Summary   Patient ID: Heather Lambert MRN: [id number] DOB/AGE: [birth year] 61 y.o.  Admit date: 10/27/2017 Discharge date: 10/29/2017  Primary Diagnosis:  Osteoarthritis Right knee(s)   Admission Diagnoses:  Past Medical History:  Diagnosis Date  . Allergy 1995   enviromental food, grasses, smoke  . Arthritis    knees  . Complication of anesthesia    versed not reacting well per M.D.  . GERD (gastroesophageal reflux disease)    occasional  . Heart murmur    hx of  ??  . History of kidney stones   . Hyperlipidemia   . Hypertension   . Pneumonia    walking pneumonia   Discharge Diagnoses:   Active Problems:   OA (osteoarthritis) of knee  Estimated body mass index is 32.01 kg/m as calculated from the following:   Height as of this encounter: '5\' 2"'$  (1.575 m).   Weight as of this encounter: 79.4 kg (175 lb).  Procedure:  Procedure(s) (LRB): RIGHT TOTAL KNEE ARTHROPLASTY (Right)   Consults: None  HPI: Heather Lambert is a 62 y.o. year old female with end stage OA of her right knee with progressively worsening pain and dysfunction. She has constant pain, with activity and at rest and significant functional deficits with difficulties even with ADLs. She has had extensive non-op management including analgesics, injections of cortisone and viscosupplements, and home exercise program, but remains in significant pain with significant dysfunction.Radiographs show bone on bone arthritis medial and patellofemoral. She presents now for right Total Knee Arthroplasty.     Laboratory Data: Admission on 10/27/2017  Component Date Value Ref Range Status  . WBC 10/28/2017 10.2  4.0 - 10.5 K/uL Final  . RBC 10/28/2017 4.11  3.87 - 5.11 MIL/uL Final  . Hemoglobin 10/28/2017 12.1  12.0 - 15.0 g/dL Final  . HCT 10/28/2017 37.3  36.0 - 46.0 % Final  . MCV 10/28/2017 90.8  78.0 - 100.0 fL Final  . MCH 10/28/2017 29.4  26.0 - 34.0 pg Final  . MCHC 10/28/2017 32.4  30.0 -  36.0 g/dL Final  . RDW 10/28/2017 12.8  11.5 - 15.5 % Final  . Platelets 10/28/2017 232  150 - 400 K/uL Final   Performed at [facility], [place] [address], [place], [place] [address]  . Sodium 10/28/2017 141  135 - 145 mmol/L Final  . Potassium 10/28/2017 4.1  3.5 - 5.1 mmol/L Final  . Chloride 10/28/2017 108  101 - 111 mmol/L Final  . CO2 10/28/2017 26  22 - 32 mmol/L Final  . Glucose, Bld 10/28/2017 229* 65 - 99 mg/dL Final  . BUN 10/28/2017 12  6 - 20 mg/dL Final  . Creatinine, Ser 10/28/2017 0.69  0.44 - 1.00 mg/dL Final  . Calcium 10/28/2017 8.5* 8.9 - 10.3 mg/dL Final  . GFR calc non Af Amer 10/28/2017 >60  >60 mL/min Final  . GFR calc Af Amer 10/28/2017 >60  >60 mL/min Final   Comment: (NOTE) The eGFR has been calculated using the CKD EPI equation. This calculation has not been validated in all clinical situations. eGFR's persistently <60 mL/min signify possible Chronic Kidney Disease.   Georgiann Hahn gap 10/28/2017 7  5 - 15 Final   Performed at [facility], [place] [address] [place], [place] [address]  Hospital Outpatient Visit on 10/20/2017  Component Date Value Ref Range Status  . MRSA, PCR 10/20/2017 NEGATIVE  NEGATIVE Final  . Staphylococcus aureus 10/20/2017 POSITIVE* NEGATIVE Final   Comment: (NOTE)  The Xpert SA Assay (FDA approved for NASAL specimens in patients 20 years of age and older), is one component of a comprehensive surveillance program. It is not intended to diagnose infection nor to guide or monitor treatment. Performed at [facility], [place] [address] [place], [place] [address]   . aPTT 10/20/2017 28  24 - 36 seconds Final   Performed at [facility], [place] [address], [place], [place] [address]  . WBC 10/20/2017 4.7  4.0 - 10.5 K/uL Final  . RBC 10/20/2017 5.13* 3.87 - 5.11 MIL/uL Final  . Hemoglobin 10/20/2017 15.0  12.0 - 15.0 g/dL Final  . HCT 10/20/2017 46.0  36.0 - 46.0 % Final    . MCV 10/20/2017 89.7  78.0 - 100.0 fL Final  . MCH 10/20/2017 29.2  26.0 - 34.0 pg Final  . MCHC 10/20/2017 32.6  30.0 - 36.0 g/dL Final  . RDW 10/20/2017 12.8  11.5 - 15.5 % Final  . Platelets 10/20/2017 272  150 - 400 K/uL Final   Performed at [facility], [place] [address] [place], [place] [address]  . Sodium 10/20/2017 142  135 - 145 mmol/L Final  . Potassium 10/20/2017 4.3  3.5 - 5.1 mmol/L Final  . Chloride 10/20/2017 108  101 - 111 mmol/L Final  . CO2 10/20/2017 24  22 - 32 mmol/L Final  . Glucose, Bld 10/20/2017 92  65 - 99 mg/dL Final  . BUN 10/20/2017 16  6 - 20 mg/dL Final  . Creatinine, Ser 10/20/2017 0.68  0.44 - 1.00 mg/dL Final  . Calcium 10/20/2017 9.3  8.9 - 10.3 mg/dL Final  . Total Protein 10/20/2017 7.3  6.5 - 8.1 g/dL Final  . Albumin 10/20/2017 4.3  3.5 - 5.0 g/dL Final  . AST 10/20/2017 29  15 - 41 U/L Final  . ALT 10/20/2017 21  14 - 54 U/L Final  . Alkaline Phosphatase 10/20/2017 78  38 - 126 U/L Final  . Total Bilirubin 10/20/2017 1.0  0.3 - 1.2 mg/dL Final  . GFR calc non Af Amer 10/20/2017 >60  >60 mL/min Final  . GFR calc Af Amer 10/20/2017 >60  >60 mL/min Final   Comment: (NOTE) The eGFR has been calculated using the CKD EPI equation. This calculation has not been validated in all clinical situations. eGFR's persistently <60 mL/min signify possible Chronic Kidney Disease.   Georgiann Hahn gap 10/20/2017 10  5 - 15 Final   Performed at [facility], [place] [address] [place], [place] [address]  . Prothrombin Time 10/20/2017 13.7  11.4 - 15.2 seconds Final  . INR 10/20/2017 1.06   Final   Performed at [place] [address], [place], [place] [address]  . ABO/RH(D) 10/20/2017 O POS   Final  . Antibody Screen 10/20/2017 NEG   Final  . Sample Expiration 10/20/2017 10/30/2017   Final  . Extend sample reason 10/20/2017    Final                   Value:NO TRANSFUSIONS OR PREGNANCY IN THE PAST 3  MONTHS Performed at [facility], [place] [address] [place], [place] [address]      X-Rays:No results found.  EKG: Orders placed or performed during the hospital encounter of 02/04/17  . EKG 12 lead  . EKG 12 lead     Hospital Course: Heather Lambert is a 62 y.o. who was admitted to [facility]. They  were brought to the operating room on 10/27/2017 and underwent Procedure(s): RIGHT TOTAL KNEE ARTHROPLASTY.  Patient tolerated the procedure well and was later transferred to the recovery room and then to the orthopaedic floor for postoperative care.  They were given PO and IV analgesics for pain control following their surgery.  They were given 24 hours of postoperative antibiotics of  Anti-infectives (From admission, onward)   Start     Dose/Rate Route Frequency Ordered Stop   10/27/17 1800  ceFAZolin (ANCEF) IVPB 1 g/50 mL premix     1 g 100 mL/hr over 30 Minutes Intravenous Every 6 hours 10/27/17 1411 10/28/17 0045   10/27/17 0915  ceFAZolin (ANCEF) IVPB 2g/100 mL premix     2 g 200 mL/hr over 30 Minutes Intravenous On call to O.R. 10/27/17 9357 10/27/17 1150     and started on DVT prophylaxis in the form of Xarelto.   PT and OT were ordered for total joint protocol.  Discharge planning consulted to help with postop disposition and equipment needs.  Patient had a tough night on the evening of surgery with some nausea and vomiting..  They started to get up OOB with therapy on day one. Hemovac drain was pulled without difficulty.  Continued to work with therapy into day two.  Dressing was changed on day two and the incision was healing well. Patient was seen in rounds on day two and was ready to go home.   Diet: Cardiac diet Activity:WBAT Follow-up:in 2 weeks Disposition - Home Discharged Condition: stable   Discharge Instructions    Call MD / Call 911   Complete by:  As directed    If you experience chest pain or shortness of breath, CALL 911 and be  transported to the hospital emergency room.  If you develope a fever above 101 F, pus (white drainage) or increased drainage or redness at the wound, or calf pain, call your surgeon's office.   Change dressing   Complete by:  As directed    Change dressing daily with sterile 4 x 4 inch gauze dressing and apply TED hose. Do not submerge the incision under water.   Constipation Prevention   Complete by:  As directed    Drink plenty of fluids.  Prune juice may be helpful.  You may use a stool softener, such as Colace (over the counter) 100 mg twice a day.  Use MiraLax (over the counter) for constipation as needed.   Diet - low sodium heart healthy   Complete by:  As directed    Discharge instructions   Complete by:  As directed    Take Xarelto for two and a half more weeks, then discontinue Xarelto. Once the patient has completed the blood thinner regimen, then take a Baby 81 mg Aspirin daily for three more weeks.   Pick up stool softner and laxative for home use following surgery while on pain medications. Do not submerge incision under water. Please use good hand washing techniques while changing dressing each day. May shower starting three days after surgery. Please use a clean towel to pat the incision dry following showers. Continue to use ice for pain and swelling after surgery. Do not use any lotions or creams on the incision until instructed by your surgeon.  Wear both TED hose on both legs during the day every day for three weeks, but may remove the TED hose at night at home.  Postoperative Constipation Protocol  Constipation - defined medically as fewer than three  stools per week and severe constipation as less than one stool per week.  One of the most common issues patients have following surgery is constipation.  Even if you have a regular bowel pattern at home, your normal regimen is likely to be disrupted due to multiple reasons following surgery.  Combination of anesthesia,  postoperative narcotics, change in appetite and fluid intake all can affect your bowels.  In order to avoid complications following surgery, here are some recommendations in order to help you during your recovery period.  Colace (docusate) - Pick up an over-the-counter form of Colace or another stool softener and take twice a day as long as you are requiring postoperative pain medications.  Take with a full glass of water daily.  If you experience loose stools or diarrhea, hold the colace until you stool forms back up.  If your symptoms do not get better within 1 week or if they get worse, check with your doctor.  Dulcolax (bisacodyl) - Pick up over-the-counter and take as directed by the product packaging as needed to assist with the movement of your bowels.  Take with a full glass of water.  Use this product as needed if not relieved by Colace only.   MiraLax (polyethylene glycol) - Pick up over-the-counter to have on hand.  MiraLax is a solution that will increase the amount of water in your bowels to assist with bowel movements.  Take as directed and can mix with a glass of water, juice, soda, coffee, or tea.  Take if you go more than two days without a movement. Do not use MiraLax more than once per day. Call your doctor if you are still constipated or irregular after using this medication for 7 days in a row.  If you continue to have problems with postoperative constipation, please contact the office for further assistance and recommendations.  If you experience "the worst abdominal pain ever" or develop nausea or vomiting, please contact the office immediatly for further recommendations for treatment.   Do not put a pillow under the knee. Place it under the heel.   Complete by:  As directed    Do not sit on low chairs, stoools or toilet seats, as it may be difficult to get up from low surfaces   Complete by:  As directed    Driving restrictions   Complete by:  As directed    No driving until  released by the physician.   Increase activity slowly as tolerated   Complete by:  As directed    Lifting restrictions   Complete by:  As directed    No lifting until released by the physician.   Patient may shower   Complete by:  As directed    You may shower without a dressing once there is no drainage.  Do not wash over the wound.  If drainage remains, do not shower until drainage stops.   TED hose   Complete by:  As directed    Use stockings (TED hose) for 3 weeks on both leg(s).  You may remove them at night for sleeping.   Weight bearing as tolerated   Complete by:  As directed    Laterality:  right   Extremity:  Lower     Allergies as of 10/28/2017      Reactions   Midazolam Other (See Comments)   (Versed) Reaction during lithotripsy   Nickel Swelling      Medication List    STOP taking these medications  MULTIVITAMIN PO   naproxen sodium 220 MG tablet Commonly known as:  ALEVE   VITAMIN D3 PO     TAKE these medications   gabapentin 300 MG capsule Commonly known as:  NEURONTIN Take 1 capsule (300 mg total) by mouth 3 (three) times daily. Gabapentin 300 mg Protocol Take a 300 mg capsule three times a day for two weeks, Then a 300 mg capsule twice a day for two weeks, Then a 300 mg capsule once a day for two weeks, then discontinue the Gabapentin.   methocarbamol 500 MG tablet Commonly known as:  ROBAXIN Take 1 tablet (500 mg total) by mouth every 6 (six) hours as needed for muscle spasms.   olmesartan 40 MG tablet Commonly known as:  BENICAR Take 40 mg by mouth at bedtime.   oxyCODONE 5 MG immediate release tablet Commonly known as:  Oxy IR/ROXICODONE Take 1-2 tablets (5-10 mg total) by mouth every 4 (four) hours as needed for moderate pain or severe pain.   rivaroxaban 10 MG Tabs tablet Commonly known as:  XARELTO Take 1 tablet (10 mg total) by mouth daily with breakfast. Take Xarelto for two and a half more weeks following discharge from the hospital,  then discontinue Xarelto. Once the patient has completed the blood thinner regimen, then take a Baby 81 mg Aspirin daily for three more weeks. Start taking on:  10/29/2017   rosuvastatin 5 MG tablet Commonly known as:  CRESTOR Take 5 mg by mouth at bedtime.   traMADol 50 MG tablet Commonly known as:  ULTRAM Take 1-2 tablets (50-100 mg total) by mouth every 6 (six) hours as needed (mild pain). What changed:  reasons to take this            Discharge Care Instructions  (From admission, onward)        Start     Ordered   10/28/17 0000  Weight bearing as tolerated    Question Answer Comment  Laterality right   Extremity Lower      10/28/17 1958   10/28/17 0000  Change dressing    Comments:  Change dressing daily with sterile 4 x 4 inch gauze dressing and apply TED hose. Do not submerge the incision under water.   10/28/17 1958     Follow-up Information    Gaynelle Arabian, MD. Schedule an appointment as soon as possible for a visit on 11/11/2017.   Specialty:  Orthopedic Surgery Contact information: [address] [place] [address] [phone]           Signed: Arlee Muslim, PA-C Orthopaedic Surgery 10/28/2017, 8:00 PM

## 2017-10-28 NOTE — Progress Notes (Signed)
Physical Therapy Treatment Patient Details Name: Heather Lambert MRN: [id number] DOB: 28-Sep-1955 Today's Date: 10/28/2017    History of Present Illness R TKA, L TKA 7/18    PT Comments    Encouraging knee extension on the right.  Reports easy to stiffen.  Continue PT.   Follow Up Recommendations  Follow surgeon's recommendation for DC plan and follow-up therapies     Equipment Recommendations  None recommended by PT    Recommendations for Other Services       Precautions / Restrictions Precautions Precautions: Knee;Fall Required Braces or Orthoses: Knee Immobilizer - Right Knee Immobilizer - Right: Discontinue once straight leg raise with < 10 degree lag    Mobility  Bed Mobility           Stairs            Wheelchair Mobility    Modified Rankin (Stroke Patients Only)       Balance                                            Cognition Arousal/Alertness: Awake/alert Behavior During Therapy: WFL for tasks assessed/performed Overall Cognitive Status: Within Functional Limits for tasks assessed                                        Exercises Total Joint Exercises Ankle Circles/Pumps: AROM;Both;10 reps Quad Sets: AROM;Both;10 reps Heel Slides: AAROM;Right;Left;10 reps;15 reps Hip ABduction/ADduction: AAROM;Right;10 reps Straight Leg Raises: AAROM;Right;10 reps    General Comments        Pertinent Vitals/Pain Pain Score: 6  Pain Location: right knee Pain Descriptors / Indicators: Aching;Discomfort Pain Intervention(s): Monitored during session;Premedicated before session    Home Living Family/patient expects to be discharged to:: Private residence Living Arrangements: Non-relatives/Friends Available Help at Discharge: Available 24 hours/day;Friend(s) Type of Home: House Home Access: Stairs to enter Entrance Stairs-Rails: Left Home Layout: Two level;Able to live on main level with bedroom/bathroom Home  Equipment: Dan Humphreys - 2 wheels;Shower seat      Prior Function Level of Independence: Independent      Comments: 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), drives   PT Goals (current goals can now be found in the care plan section) Acute Rehab PT Goals Patient Stated Goal: go home PT Goal Formulation: With patient Time For Goal Achievement: 11/01/17 Potential to Achieve Goals: Good Progress towards PT goals: Progressing toward goals    Frequency    7X/week      PT Plan Current plan remains appropriate    Co-evaluation              AM-PAC PT "6 Clicks" Daily Activity  Outcome Measure  Difficulty turning over in bed (including adjusting bedclothes, sheets and blankets)?: A Lot Difficulty moving from lying on back to sitting on the side of the bed? : A Lot Difficulty sitting down on and standing up from a chair with arms (e.g., wheelchair, bedside commode, etc,.)?: A Lot Help needed moving to and from a bed to chair (including a wheelchair)?: A Lot Help needed walking in hospital room?: A Lot Help needed climbing 3-5 steps with a railing? : A Lot 6 Click Score: 12    End of Session Equipment Utilized During Treatment: Right knee immobilizer Activity Tolerance: Patient tolerated treatment well Patient left:  in bed Nurse Communication: Mobility status PT Visit Diagnosis: Unsteadiness on feet (R26.81);Pain Pain - Right/Left: Right Pain - part of body: Knee     Time: [id number] PT Time Calculation (min) (ACUTE ONLY): 21 min  Charges:  $Gait Training: 23-37 mins $Therapeutic Exercise: 8-22 mins                    G CodesBlanchard Kelch:       Rennae Ferraiolo PT [phone]    Rada HayHill, Beau [place] [name] 10/28/2017, 4:55 PM

## 2017-10-28 NOTE — Progress Notes (Signed)
   Subjective: 1 Day Post-Op Procedure(s) (LRB): RIGHT TOTAL KNEE ARTHROPLASTY (Right) Patient reports pain as mild.   Patient seen in rounds for Dr. Lequita HaltAluisio. Patient is well, but has had some minor complaints of pain in the knee, requiring pain medications We will start therapy today.  She had some nausea and vomiting yesterday but none on morning rounds.  She also had some soft pressures and was given fluid bolus this morning.Pressure did improve this afternoon. Plan is to go Home after hospital stay.  Objective: Vital signs in last 24 hours: Temp:  [97.7 F (36.5 C)-98.2 F (36.8 C)] 97.7 F (36.5 C) (03/26 1336) Pulse Rate:  [59-91] 91 (03/26 1336) Resp:  [14-16] 16 (03/26 1336) BP: (95-135)/(54-65) 135/65 (03/26 1336) SpO2:  [99 %-100 %] 99 % (03/26 1336)  Intake/Output from previous day:  Intake/Output Summary (Last 24 hours) at 10/28/2017 1947 Last data filed at 10/28/2017 1847 Gross per 24 hour  Intake 3086.25 ml  Output 2072.5 ml  Net 1013.75 ml    Intake/Output this shift: No intake/output data recorded.  Labs: Recent Labs    10/28/17 0617  HGB 12.1   Recent Labs    10/28/17 0617  WBC 10.2  RBC 4.11  HCT 37.3  PLT 232   Recent Labs    10/28/17 0617  NA 141  K 4.1  CL 108  CO2 26  BUN 12  CREATININE 0.69  GLUCOSE 229*  CALCIUM 8.5*   No results for input(s): LABPT, INR in the last 72 hours.  EXAM General - Patient is Alert, Appropriate and Oriented Extremity - Neurovascular intact Sensation intact distally Intact pulses distally Dorsiflexion/Plantar flexion intact Dressing - dressing C/D/I Motor Function - intact, moving foot and toes well on exam.  Hemovac pulled without difficulty.  Past Medical History:  Diagnosis Date  . Allergy 1995   enviromental food, grasses, smoke  . Arthritis    knees  . Complication of anesthesia    versed not reacting well per M.D.  . GERD (gastroesophageal reflux disease)    occasional  . Heart  murmur    hx of  ??  . History of kidney stones   . Hyperlipidemia   . Hypertension   . Pneumonia    walking pneumonia    Assessment/Plan: 1 Day Post-Op Procedure(s) (LRB): RIGHT TOTAL KNEE ARTHROPLASTY (Right) Active Problems:   OA (osteoarthritis) of knee  Estimated body mass index is 32.01 kg/m as calculated from the following:   Height as of this encounter: 5\' 2"  (1.575 m).   Weight as of this encounter: 79.4 kg (175 lb). Advance diet Up with therapy Plan for discharge tomorrow  DVT Prophylaxis - Xarelto Weight-Bearing as tolerated to right leg D/C O2 and Pulse OX and try on Room Air  Avel Peacerew Makynlie Rossini, PA-C Orthopaedic Surgery 10/28/2017, 7:47 PM

## 2017-10-28 NOTE — Evaluation (Signed)
Physical Therapy Evaluation Patient Details Name: Heather Lambert MRN: [id number] DOB: Jan 08, 1956 Today's Date: 10/28/2017   History of Present Illness  R TKA, L TKA 7/18  Clinical Impression  The patient has decreased tolerance to weight on the right  Leg. Tends to toe touch. Pt admitted with above diagnosis. Pt currently with functional limitations due to the deficits listed below (see PT Problem List). Pt will benefit from skilled PT to increase their independence and safety with mobility to allow discharge to the venue listed below.       Follow Up Recommendations Follow surgeon's recommendation for DC plan and follow-up therapies    Equipment Recommendations  None recommended by PT    Recommendations for Other Services       Precautions / Restrictions Precautions Precautions: Knee;Fall Required Braces or Orthoses: Knee Immobilizer - Right Knee Immobilizer - Right: Discontinue once straight leg raise with < 10 degree lag      Mobility  Bed Mobility Overal bed mobility: Needs Assistance Bed Mobility: Supine to Sit     Supine to sit: Min assist     General bed mobility comments: assist for right leg  Transfers Overall transfer level: Needs assistance Equipment used: Rolling walker (2 wheeled) Transfers: Sit to/from Stand Sit to Stand: Min assist         General transfer comment: cues for technique  Ambulation/Gait Ambulation/Gait assistance: Min assist Ambulation Distance (Feet): 10 Feet(x2) Assistive device: Rolling walker (2 wheeled) Gait Pattern/deviations: Step-to pattern;Decreased step length - right;Decreased stance time - right;Antalgic     General Gait Details: does not place right foot flat  Stairs            Wheelchair Mobility    Modified Rankin (Stroke Patients Only)       Balance                                             Pertinent Vitals/Pain Pain Score: 7  Pain Descriptors / Indicators:  Aching;Discomfort Pain Intervention(s): Monitored during session;Limited activity within patient's tolerance;RN gave pain meds during session;Ice applied    Home Living Family/patient expects to be discharged to:: Private residence Living Arrangements: Non-relatives/Friends Available Help at Discharge: Available 24 hours/day;Friend(s) Type of Home: House Home Access: Stairs to enter Entrance Stairs-Rails: Left Entrance Stairs-Number of Steps: 2 Home Layout: Two level;Able to live on main level with bedroom/bathroom Home Equipment: Dan HumphreysWalker - 2 wheels;Shower seat      Prior Function Level of Independence: Independent         Comments: 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), drives     Higher education careers adviserHand Dominance        Extremity/Trunk Assessment   Upper Extremity Assessment Upper Extremity Assessment: Overall WFL for tasks assessed    Lower Extremity Assessment Lower Extremity Assessment: RLE deficits/detail RLE Deficits / Details: 10-30 knee flexion,    Cervical / Trunk Assessment Cervical / Trunk Assessment: Kyphotic  Communication   Communication: No difficulties  Cognition Arousal/Alertness: Awake/alert Behavior During Therapy: WFL for tasks assessed/performed Overall Cognitive Status: Within Functional Limits for tasks assessed                                        General Comments      Exercises Total Joint Exercises Ankle Circles/Pumps: AROM;Both;10 reps  Quad Sets: AROM;Both;10 reps   Assessment/Plan    PT Assessment Patient needs continued PT services  PT Problem List Decreased strength;Decreased range of motion;Decreased knowledge of use of DME;Decreased activity tolerance;Decreased safety awareness;Decreased knowledge of precautions;Decreased mobility;Pain       PT Treatment Interventions DME instruction;Gait training;Stair training;Functional mobility training;Therapeutic activities;Therapeutic exercise;Patient/family education    PT Goals  (Current goals can be found in the Care Plan section)  Acute Rehab PT Goals Patient Stated Goal: go home PT Goal Formulation: With patient Time For Goal Achievement: 11/01/17 Potential to Achieve Goals: Good    Frequency 7X/week   Barriers to discharge        Co-evaluation               AM-PAC PT "6 Clicks" Daily Activity  Outcome Measure Difficulty turning over in bed (including adjusting bedclothes, sheets and blankets)?: A Lot Difficulty moving from lying on back to sitting on the side of the bed? : A Lot Difficulty sitting down on and standing up from a chair with arms (e.g., wheelchair, bedside commode, etc,.)?: A Lot Help needed moving to and from a bed to chair (including a wheelchair)?: A Lot Help needed walking in hospital room?: A Lot Help needed climbing 3-5 steps with a railing? : A Lot 6 Click Score: 12    End of Session Equipment Utilized During Treatment: Right knee immobilizer Activity Tolerance: Patient limited by pain Patient left: in chair;with call bell/phone within reach Nurse Communication: Mobility status PT Visit Diagnosis: Unsteadiness on feet (R26.81);Pain Pain - Right/Left: Right Pain - part of body: Knee    Time: 4098-1191 PT Time Calculation (min) (ACUTE ONLY): 18 min   Charges:   PT Evaluation $PT Eval Low Complexity: 1 Low     PT G CodesBlanchard Kelch PT [phone] }  Rada Hay 10/28/2017, 4:48 PM

## 2017-10-28 NOTE — Progress Notes (Signed)
Physical Therapy Treatment Patient Details Name: Heather Lambert MRN: [id number] DOB: 1956-04-26 Today's Date: 10/28/2017    History of Present Illness R TKA, L TKA 7/18    PT Comments    Decreased knee extension, Encouraged propping in neutral position.  Decreased weight on right leg, posture flexed.   Follow Up Recommendations  Follow surgeon's recommendation for DC plan and follow-up therapies     Equipment Recommendations  None recommended by PT    Recommendations for Other Services       Precautions / Restrictions Precautions Precautions: Knee;Fall Required Braces or Orthoses: Knee Immobilizer - Right Knee Immobilizer - Right: Discontinue once straight leg raise with < 10 degree lag    Mobility  Bed Mobility Overal bed mobility: Needs Assistance Bed Mobility: Sit to Supine     Supine to sit: Min assist Sit to supine: Min assist   General bed mobility comments: assist with right leg  Transfers Overall transfer level: Needs assistance Equipment used: Rolling walker (2 wheeled) Transfers: Sit to/from Stand Sit to Stand: Min assist         General transfer comment: cues for technique  Ambulation/Gait Ambulation/Gait assistance: Min assist Ambulation Distance (Feet): 60 Feet Assistive device: Rolling walker (2 wheeled) Gait Pattern/deviations: Step-to pattern;Decreased step length - right;Decreased stance time - right;Antalgic     General Gait Details: does not place right foot flat   Stairs            Wheelchair Mobility    Modified Rankin (Stroke Patients Only)       Balance                                            Cognition Arousal/Alertness: Awake/alert Behavior During Therapy: WFL for tasks assessed/performed Overall Cognitive Status: Within Functional Limits for tasks assessed                                        Exercises Total Joint Exercises Ankle Circles/Pumps: AROM;Both;10  reps Quad Sets: AROM;Both;10 reps    General Comments        Pertinent Vitals/Pain Pain Score: 6  Pain Location: right knee Pain Descriptors / Indicators: Aching;Discomfort Pain Intervention(s): Monitored during session;Premedicated before session    Home Living Family/patient expects to be discharged to:: Private residence Living Arrangements: Non-relatives/Friends Available Help at Discharge: Available 24 hours/day;Friend(s) Type of Home: House Home Access: Stairs to enter Entrance Stairs-Rails: Left Home Layout: Two level;Able to live on main level with bedroom/bathroom Home Equipment: Dan Humphreys - 2 wheels;Shower seat      Prior Function Level of Independence: Independent      Comments: 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), drives   PT Goals (current goals can now be found in the care plan section) Acute Rehab PT Goals Patient Stated Goal: go home PT Goal Formulation: With patient Time For Goal Achievement: 11/01/17 Potential to Achieve Goals: Good Progress towards PT goals: Progressing toward goals    Frequency    7X/week      PT Plan Current plan remains appropriate    Co-evaluation              AM-PAC PT "6 Clicks" Daily Activity  Outcome Measure  Difficulty turning over in bed (including adjusting bedclothes, sheets and blankets)?: A Lot Difficulty moving from  lying on back to sitting on the side of the bed? : A Lot Difficulty sitting down on and standing up from a chair with arms (e.g., wheelchair, bedside commode, etc,.)?: A Lot Help needed moving to and from a bed to chair (including a wheelchair)?: A Lot Help needed walking in hospital room?: A Lot Help needed climbing 3-5 steps with a railing? : A Lot 6 Click Score: 12    End of Session Equipment Utilized During Treatment: Right knee immobilizer Activity Tolerance: Patient tolerated treatment well Patient left: in bed;in CPM Nurse Communication: Mobility status PT Visit Diagnosis:  Unsteadiness on feet (R26.81);Pain Pain - Right/Left: Right Pain - part of body: Knee     Time: 1000-1035 PT Time Calculation (min) (ACUTE ONLY): 35 min  Charges:  $Gait Training: 23-37 mins                    G Codes:          Rada HayHill, Kavitha Lansdale [name] 10/28/2017, 4:51 PM

## 2017-10-29 LAB — CBC
HEMATOCRIT: 38.3 % (ref 36.0–46.0)
Hemoglobin: 12.4 g/dL (ref 12.0–15.0)
MCH: 29.3 pg (ref 26.0–34.0)
MCHC: 32.4 g/dL (ref 30.0–36.0)
MCV: 90.5 fL (ref 78.0–100.0)
Platelets: 256 10*3/uL (ref 150–400)
RBC: 4.23 MIL/uL (ref 3.87–5.11)
RDW: 13.1 % (ref 11.5–15.5)
WBC: 10.8 10*3/uL — AB (ref 4.0–10.5)

## 2017-10-29 LAB — BASIC METABOLIC PANEL
ANION GAP: 9 (ref 5–15)
BUN: 11 mg/dL (ref 6–20)
CALCIUM: 8.9 mg/dL (ref 8.9–10.3)
CO2: 28 mmol/L (ref 22–32)
Chloride: 105 mmol/L (ref 101–111)
Creatinine, Ser: 0.69 mg/dL (ref 0.44–1.00)
GLUCOSE: 125 mg/dL — AB (ref 65–99)
Potassium: 4 mmol/L (ref 3.5–5.1)
Sodium: 142 mmol/L (ref 135–145)

## 2017-10-29 NOTE — Progress Notes (Signed)
Physical Therapy Treatment Patient Details Name: Heather PortelaMelinda Lambert MRN: [id number] DOB: 04-13-1956 Today's Date: 10/29/2017    History of Present Illness R TKA, L TKA 7/18    PT Comments    Ready for Dc.   Follow Up Recommendations  Follow surgeon's recommendation for DC plan and follow-up therapies     Equipment Recommendations  None recommended by PT    Recommendations for Other Services       Precautions / Restrictions Precautions Precautions: Knee;Fall Required Braces or Orthoses: Knee Immobilizer - Right Knee Immobilizer - Right: Discontinue once straight leg raise with < 10 degree lag    Mobility  Bed Mobility Overal bed mobility: Modified Independent                Transfers Overall transfer level: Needs assistance Equipment used: Rolling walker (2 wheeled) Transfers: Sit to/from Stand Sit to Stand: Supervision            Ambulation/Gait Ambulation/Gait assistance: Min guard Ambulation Distance (Feet): 40 Feet   Gait Pattern/deviations: Step-to pattern;Decreased step length - right;Decreased stance time - right;Antalgic     General Gait Details: does not place right foot flat   Stairs Stairs: Yes   Stair Management: Backwards;With walker Number of Stairs: 2 General stair comments: friend present  Wheelchair Mobility    Modified Rankin (Stroke Patients Only)       Balance                                            Cognition Arousal/Alertness: Awake/alert                                            Exercises  Reviewed elevation, knee extension and exercises.    General Comments        Pertinent Vitals/Pain Pain Score: 6  Pain Location: right knee Pain Descriptors / Indicators: Aching;Discomfort    Home Living                      Prior Function            PT Goals (current goals can now be found in the care plan section) Progress towards PT goals: Progressing toward  goals    Frequency    7X/week      PT Plan Current plan remains appropriate    Co-evaluation              AM-PAC PT "6 Clicks" Daily Activity  Outcome Measure  Difficulty turning over in bed (including adjusting bedclothes, sheets and blankets)?: A Little Difficulty moving from lying on back to sitting on the side of the bed? : A Little Difficulty sitting down on and standing up from a chair with arms (e.g., wheelchair, bedside commode, etc,.)?: A Little     Help needed climbing 3-5 steps with a railing? : A Lot 6 Click Score: 11    End of Session Equipment Utilized During Treatment: Right knee immobilizer Activity Tolerance: Patient tolerated treatment well Patient left: in chair Nurse Communication: Mobility status PT Visit Diagnosis: Unsteadiness on feet (R26.81);Pain Pain - Right/Left: Right Pain - part of body: Knee     Time: 1136-1206 PT Time Calculation (min) (ACUTE ONLY): 30 min  Charges:  $Gait Training: 8-22  mins $Therapeutic Exercise: 8-22 mins                    G Codes:           Rada Hay 10/29/2017, 5:55 PM

## 2017-10-29 NOTE — Plan of Care (Signed)
Plan of care has been discussed.

## 2017-10-30 DIAGNOSIS — M1711 Unilateral primary osteoarthritis, right knee: Secondary | ICD-10-CM | POA: Diagnosis not present

## 2017-11-03 DIAGNOSIS — M1711 Unilateral primary osteoarthritis, right knee: Secondary | ICD-10-CM | POA: Diagnosis not present

## 2017-11-05 DIAGNOSIS — M1711 Unilateral primary osteoarthritis, right knee: Secondary | ICD-10-CM | POA: Diagnosis not present

## 2017-11-07 DIAGNOSIS — M1711 Unilateral primary osteoarthritis, right knee: Secondary | ICD-10-CM | POA: Diagnosis not present

## 2017-11-10 DIAGNOSIS — M1711 Unilateral primary osteoarthritis, right knee: Secondary | ICD-10-CM | POA: Diagnosis not present

## 2017-11-12 DIAGNOSIS — M1711 Unilateral primary osteoarthritis, right knee: Secondary | ICD-10-CM | POA: Diagnosis not present

## 2017-11-14 DIAGNOSIS — M1711 Unilateral primary osteoarthritis, right knee: Secondary | ICD-10-CM | POA: Diagnosis not present

## 2017-11-17 DIAGNOSIS — M1711 Unilateral primary osteoarthritis, right knee: Secondary | ICD-10-CM | POA: Diagnosis not present

## 2017-11-19 DIAGNOSIS — M1711 Unilateral primary osteoarthritis, right knee: Secondary | ICD-10-CM | POA: Diagnosis not present

## 2017-11-21 DIAGNOSIS — M1711 Unilateral primary osteoarthritis, right knee: Secondary | ICD-10-CM | POA: Diagnosis not present

## 2017-11-24 DIAGNOSIS — M1711 Unilateral primary osteoarthritis, right knee: Secondary | ICD-10-CM | POA: Diagnosis not present

## 2017-11-26 DIAGNOSIS — M1711 Unilateral primary osteoarthritis, right knee: Secondary | ICD-10-CM | POA: Diagnosis not present

## 2017-12-01 DIAGNOSIS — M1711 Unilateral primary osteoarthritis, right knee: Secondary | ICD-10-CM | POA: Diagnosis not present

## 2017-12-02 DIAGNOSIS — Z471 Aftercare following joint replacement surgery: Secondary | ICD-10-CM | POA: Diagnosis not present

## 2017-12-02 DIAGNOSIS — Z96651 Presence of right artificial knee joint: Secondary | ICD-10-CM | POA: Diagnosis not present

## 2017-12-03 DIAGNOSIS — Z96653 Presence of artificial knee joint, bilateral: Secondary | ICD-10-CM | POA: Diagnosis not present

## 2017-12-03 DIAGNOSIS — M1711 Unilateral primary osteoarthritis, right knee: Secondary | ICD-10-CM | POA: Diagnosis not present

## 2017-12-03 DIAGNOSIS — M24561 Contracture, right knee: Secondary | ICD-10-CM | POA: Diagnosis not present

## 2017-12-03 DIAGNOSIS — M24562 Contracture, left knee: Secondary | ICD-10-CM | POA: Diagnosis not present

## 2017-12-05 DIAGNOSIS — M24562 Contracture, left knee: Secondary | ICD-10-CM | POA: Diagnosis not present

## 2017-12-05 DIAGNOSIS — M1711 Unilateral primary osteoarthritis, right knee: Secondary | ICD-10-CM | POA: Diagnosis not present

## 2017-12-05 DIAGNOSIS — M24561 Contracture, right knee: Secondary | ICD-10-CM | POA: Diagnosis not present

## 2017-12-05 DIAGNOSIS — Z96653 Presence of artificial knee joint, bilateral: Secondary | ICD-10-CM | POA: Diagnosis not present

## 2017-12-08 DIAGNOSIS — M24562 Contracture, left knee: Secondary | ICD-10-CM | POA: Diagnosis not present

## 2017-12-08 DIAGNOSIS — M24561 Contracture, right knee: Secondary | ICD-10-CM | POA: Diagnosis not present

## 2017-12-08 DIAGNOSIS — Z96653 Presence of artificial knee joint, bilateral: Secondary | ICD-10-CM | POA: Diagnosis not present

## 2017-12-08 DIAGNOSIS — M1711 Unilateral primary osteoarthritis, right knee: Secondary | ICD-10-CM | POA: Diagnosis not present

## 2017-12-11 DIAGNOSIS — Z96653 Presence of artificial knee joint, bilateral: Secondary | ICD-10-CM | POA: Diagnosis not present

## 2017-12-11 DIAGNOSIS — M24562 Contracture, left knee: Secondary | ICD-10-CM | POA: Diagnosis not present

## 2017-12-11 DIAGNOSIS — M24561 Contracture, right knee: Secondary | ICD-10-CM | POA: Diagnosis not present

## 2017-12-11 DIAGNOSIS — M1711 Unilateral primary osteoarthritis, right knee: Secondary | ICD-10-CM | POA: Diagnosis not present

## 2017-12-16 DIAGNOSIS — M24562 Contracture, left knee: Secondary | ICD-10-CM | POA: Diagnosis not present

## 2017-12-16 DIAGNOSIS — M1711 Unilateral primary osteoarthritis, right knee: Secondary | ICD-10-CM | POA: Diagnosis not present

## 2017-12-16 DIAGNOSIS — Z96653 Presence of artificial knee joint, bilateral: Secondary | ICD-10-CM | POA: Diagnosis not present

## 2017-12-16 DIAGNOSIS — M24561 Contracture, right knee: Secondary | ICD-10-CM | POA: Diagnosis not present

## 2017-12-18 DIAGNOSIS — M24561 Contracture, right knee: Secondary | ICD-10-CM | POA: Diagnosis not present

## 2017-12-18 DIAGNOSIS — M24562 Contracture, left knee: Secondary | ICD-10-CM | POA: Diagnosis not present

## 2017-12-18 DIAGNOSIS — Z96653 Presence of artificial knee joint, bilateral: Secondary | ICD-10-CM | POA: Diagnosis not present

## 2017-12-18 DIAGNOSIS — M1711 Unilateral primary osteoarthritis, right knee: Secondary | ICD-10-CM | POA: Diagnosis not present

## 2017-12-23 DIAGNOSIS — M1711 Unilateral primary osteoarthritis, right knee: Secondary | ICD-10-CM | POA: Diagnosis not present

## 2017-12-23 DIAGNOSIS — M24561 Contracture, right knee: Secondary | ICD-10-CM | POA: Diagnosis not present

## 2017-12-23 DIAGNOSIS — M24562 Contracture, left knee: Secondary | ICD-10-CM | POA: Diagnosis not present

## 2017-12-23 DIAGNOSIS — Z96653 Presence of artificial knee joint, bilateral: Secondary | ICD-10-CM | POA: Diagnosis not present

## 2017-12-25 DIAGNOSIS — M1711 Unilateral primary osteoarthritis, right knee: Secondary | ICD-10-CM | POA: Diagnosis not present

## 2017-12-25 DIAGNOSIS — M24561 Contracture, right knee: Secondary | ICD-10-CM | POA: Diagnosis not present

## 2017-12-25 DIAGNOSIS — Z96653 Presence of artificial knee joint, bilateral: Secondary | ICD-10-CM | POA: Diagnosis not present

## 2017-12-25 DIAGNOSIS — M24562 Contracture, left knee: Secondary | ICD-10-CM | POA: Diagnosis not present

## 2017-12-30 DIAGNOSIS — M1711 Unilateral primary osteoarthritis, right knee: Secondary | ICD-10-CM | POA: Diagnosis not present

## 2017-12-30 DIAGNOSIS — M24562 Contracture, left knee: Secondary | ICD-10-CM | POA: Diagnosis not present

## 2017-12-30 DIAGNOSIS — M24561 Contracture, right knee: Secondary | ICD-10-CM | POA: Diagnosis not present

## 2017-12-30 DIAGNOSIS — Z96653 Presence of artificial knee joint, bilateral: Secondary | ICD-10-CM | POA: Diagnosis not present

## 2017-12-31 DIAGNOSIS — H43813 Vitreous degeneration, bilateral: Secondary | ICD-10-CM | POA: Diagnosis not present

## 2018-01-05 DIAGNOSIS — Z96653 Presence of artificial knee joint, bilateral: Secondary | ICD-10-CM | POA: Diagnosis not present

## 2018-01-05 DIAGNOSIS — M24562 Contracture, left knee: Secondary | ICD-10-CM | POA: Diagnosis not present

## 2018-01-05 DIAGNOSIS — M1711 Unilateral primary osteoarthritis, right knee: Secondary | ICD-10-CM | POA: Diagnosis not present

## 2018-01-05 DIAGNOSIS — M24561 Contracture, right knee: Secondary | ICD-10-CM | POA: Diagnosis not present

## 2018-01-21 DIAGNOSIS — M1711 Unilateral primary osteoarthritis, right knee: Secondary | ICD-10-CM | POA: Diagnosis not present

## 2018-01-21 DIAGNOSIS — Z96653 Presence of artificial knee joint, bilateral: Secondary | ICD-10-CM | POA: Diagnosis not present

## 2018-01-21 DIAGNOSIS — M24562 Contracture, left knee: Secondary | ICD-10-CM | POA: Diagnosis not present

## 2018-01-21 DIAGNOSIS — M24561 Contracture, right knee: Secondary | ICD-10-CM | POA: Diagnosis not present

## 2018-02-03 DIAGNOSIS — M24562 Contracture, left knee: Secondary | ICD-10-CM | POA: Diagnosis not present

## 2018-02-03 DIAGNOSIS — M24561 Contracture, right knee: Secondary | ICD-10-CM | POA: Diagnosis not present

## 2018-02-03 DIAGNOSIS — M1711 Unilateral primary osteoarthritis, right knee: Secondary | ICD-10-CM | POA: Diagnosis not present

## 2018-02-03 DIAGNOSIS — Z96653 Presence of artificial knee joint, bilateral: Secondary | ICD-10-CM | POA: Diagnosis not present

## 2018-02-26 DIAGNOSIS — Z23 Encounter for immunization: Secondary | ICD-10-CM | POA: Diagnosis not present

## 2018-04-14 ENCOUNTER — Other Ambulatory Visit: Payer: Self-pay | Admitting: Family Medicine

## 2018-04-14 DIAGNOSIS — Z1231 Encounter for screening mammogram for malignant neoplasm of breast: Secondary | ICD-10-CM

## 2018-04-22 DIAGNOSIS — Z1329 Encounter for screening for other suspected endocrine disorder: Secondary | ICD-10-CM | POA: Diagnosis not present

## 2018-04-22 DIAGNOSIS — Z0000 Encounter for general adult medical examination without abnormal findings: Secondary | ICD-10-CM | POA: Diagnosis not present

## 2018-04-22 DIAGNOSIS — E559 Vitamin D deficiency, unspecified: Secondary | ICD-10-CM | POA: Diagnosis not present

## 2018-04-22 DIAGNOSIS — Z114 Encounter for screening for human immunodeficiency virus [HIV]: Secondary | ICD-10-CM | POA: Diagnosis not present

## 2018-04-24 DIAGNOSIS — I10 Essential (primary) hypertension: Secondary | ICD-10-CM | POA: Diagnosis not present

## 2018-04-24 DIAGNOSIS — Z0000 Encounter for general adult medical examination without abnormal findings: Secondary | ICD-10-CM | POA: Diagnosis not present

## 2018-04-24 DIAGNOSIS — Z6833 Body mass index (BMI) 33.0-33.9, adult: Secondary | ICD-10-CM | POA: Diagnosis not present

## 2018-04-24 DIAGNOSIS — Z23 Encounter for immunization: Secondary | ICD-10-CM | POA: Diagnosis not present

## 2018-04-30 DIAGNOSIS — M1711 Unilateral primary osteoarthritis, right knee: Secondary | ICD-10-CM | POA: Diagnosis not present

## 2018-05-05 DIAGNOSIS — E669 Obesity, unspecified: Secondary | ICD-10-CM | POA: Diagnosis not present

## 2018-05-05 DIAGNOSIS — K625 Hemorrhage of anus and rectum: Secondary | ICD-10-CM | POA: Diagnosis not present

## 2018-05-05 DIAGNOSIS — K641 Second degree hemorrhoids: Secondary | ICD-10-CM | POA: Diagnosis not present

## 2018-05-05 DIAGNOSIS — Z1211 Encounter for screening for malignant neoplasm of colon: Secondary | ICD-10-CM | POA: Diagnosis not present

## 2018-05-08 ENCOUNTER — Ambulatory Visit
Admission: RE | Admit: 2018-05-08 | Discharge: 2018-05-08 | Disposition: A | Discharge status: Home / Self Care | Payer: BLUE CROSS/BLUE SHIELD | Source: Ambulatory Visit | Attending: Family Medicine | Admitting: Family Medicine

## 2018-05-08 DIAGNOSIS — Z1231 Encounter for screening mammogram for malignant neoplasm of breast: Secondary | ICD-10-CM

## 2018-05-27 DIAGNOSIS — D125 Benign neoplasm of sigmoid colon: Secondary | ICD-10-CM | POA: Diagnosis not present

## 2018-05-27 DIAGNOSIS — K6389 Other specified diseases of intestine: Secondary | ICD-10-CM | POA: Diagnosis not present

## 2018-05-27 DIAGNOSIS — K635 Polyp of colon: Secondary | ICD-10-CM | POA: Diagnosis not present

## 2018-05-27 DIAGNOSIS — Z1211 Encounter for screening for malignant neoplasm of colon: Secondary | ICD-10-CM | POA: Diagnosis not present

## 2019-02-11 DIAGNOSIS — E785 Hyperlipidemia, unspecified: Secondary | ICD-10-CM | POA: Diagnosis not present

## 2019-02-11 DIAGNOSIS — I10 Essential (primary) hypertension: Secondary | ICD-10-CM | POA: Diagnosis not present

## 2019-02-11 DIAGNOSIS — M1990 Unspecified osteoarthritis, unspecified site: Secondary | ICD-10-CM | POA: Diagnosis not present

## 2019-04-05 ENCOUNTER — Other Ambulatory Visit: Payer: Self-pay | Admitting: Family Medicine

## 2019-04-05 DIAGNOSIS — Z1231 Encounter for screening mammogram for malignant neoplasm of breast: Secondary | ICD-10-CM

## 2019-04-30 DIAGNOSIS — Z1159 Encounter for screening for other viral diseases: Secondary | ICD-10-CM | POA: Diagnosis not present

## 2019-04-30 DIAGNOSIS — E559 Vitamin D deficiency, unspecified: Secondary | ICD-10-CM | POA: Diagnosis not present

## 2019-04-30 DIAGNOSIS — Z0000 Encounter for general adult medical examination without abnormal findings: Secondary | ICD-10-CM | POA: Diagnosis not present

## 2019-05-03 DIAGNOSIS — E782 Mixed hyperlipidemia: Secondary | ICD-10-CM | POA: Diagnosis not present

## 2019-05-03 DIAGNOSIS — I10 Essential (primary) hypertension: Secondary | ICD-10-CM | POA: Diagnosis not present

## 2019-05-03 DIAGNOSIS — Z719 Counseling, unspecified: Secondary | ICD-10-CM | POA: Diagnosis not present

## 2019-05-03 DIAGNOSIS — E559 Vitamin D deficiency, unspecified: Secondary | ICD-10-CM | POA: Diagnosis not present

## 2019-05-05 DIAGNOSIS — Z6833 Body mass index (BMI) 33.0-33.9, adult: Secondary | ICD-10-CM | POA: Diagnosis not present

## 2019-05-05 DIAGNOSIS — Z23 Encounter for immunization: Secondary | ICD-10-CM | POA: Diagnosis not present

## 2019-05-05 DIAGNOSIS — Z0000 Encounter for general adult medical examination without abnormal findings: Secondary | ICD-10-CM | POA: Diagnosis not present

## 2019-05-05 DIAGNOSIS — Z1211 Encounter for screening for malignant neoplasm of colon: Secondary | ICD-10-CM | POA: Diagnosis not present

## 2019-05-10 DIAGNOSIS — H59811 Chorioretinal scars after surgery for detachment, right eye: Secondary | ICD-10-CM | POA: Diagnosis not present

## 2019-05-17 ENCOUNTER — Other Ambulatory Visit: Payer: Self-pay

## 2019-05-17 ENCOUNTER — Ambulatory Visit
Admission: RE | Admit: 2019-05-17 | Discharge: 2019-05-17 | Disposition: A | Discharge status: Home / Self Care | Payer: BC Managed Care – PPO | Source: Ambulatory Visit | Attending: Family Medicine | Admitting: Family Medicine

## 2019-05-17 DIAGNOSIS — Z1231 Encounter for screening mammogram for malignant neoplasm of breast: Secondary | ICD-10-CM

## 2019-06-14 DIAGNOSIS — M6530 Trigger finger, unspecified finger: Secondary | ICD-10-CM | POA: Diagnosis not present

## 2019-06-14 DIAGNOSIS — M19041 Primary osteoarthritis, right hand: Secondary | ICD-10-CM | POA: Diagnosis not present

## 2019-06-14 DIAGNOSIS — M19042 Primary osteoarthritis, left hand: Secondary | ICD-10-CM | POA: Diagnosis not present

## 2019-06-14 DIAGNOSIS — M7542 Impingement syndrome of left shoulder: Secondary | ICD-10-CM | POA: Diagnosis not present

## 2019-07-20 ENCOUNTER — Other Ambulatory Visit: Payer: Self-pay

## 2019-07-21 ENCOUNTER — Encounter: Payer: Self-pay | Admitting: Obstetrics & Gynecology

## 2019-07-21 ENCOUNTER — Ambulatory Visit: Payer: BC Managed Care – PPO | Admitting: Obstetrics & Gynecology

## 2019-07-21 VITALS — BP 132/88 | Ht 61.0 in | Wt 172.0 lb

## 2019-07-21 DIAGNOSIS — Z124 Encounter for screening for malignant neoplasm of cervix: Secondary | ICD-10-CM | POA: Diagnosis not present

## 2019-07-21 DIAGNOSIS — N950 Postmenopausal bleeding: Secondary | ICD-10-CM | POA: Diagnosis not present

## 2019-07-21 DIAGNOSIS — R1031 Right lower quadrant pain: Secondary | ICD-10-CM

## 2019-07-21 NOTE — Progress Notes (Signed)
    Oneal Biglow [facility] 06-21-1956 [id number]        63 y.o.  G0  Photographer/Owner of a studio  RP: PMB x 2 weeks  HPI: Patient last seen by myself in 08/2012 when a [place] Excision/D+C was done.  Patho showed a benign endometrial polyp and benign endometrium.  Postmenopausal on no HRT with no PMB from that time until 2 weeks ago.  For the past 2 weeks, mild vaginal spotting with crampy discomfort towards the Rt pelvis.  Urine and BMs normal.  Abstinent.  No Pap test since 2013.  Mammo 05/2019 negative.  Fasting Health Labs with Fam MD.  Had bilateral knee replacement in 2019.   OB History  Gravida Para Term Preterm AB Living  0 0 0 0 0 0  SAB TAB Ectopic Multiple Live Births  0 0 0 0 0    Past medical history,surgical history, problem list, medications, allergies, family history and social history were all reviewed and documented in the EPIC chart.   Directed ROS with pertinent positives and negatives documented in the history of present illness/assessment and plan.  Exam:  Vitals:   07/21/19 1421  BP: 132/88  Weight: 172 lb (78 kg)  Height: 5\' 1"  (1.549 m)   General appearance:  Normal  CVAT Negative bilaterally  Abdomen: Normal  Gynecologic exam: Vulva normal.  Speculum:  Cervix/vagina normal.  No Polyp or other lesion seen.  No blood.  Normal vaginal secretions.  Pap test reflex done on cervix.  Perianal area normal.     Assessment/Plan:  63 y.o. G0P0000   1. Postmenopausal bleeding Postmenopausal spotting for 2 weeks associated with right pelvic cramping.  Not on any hormone replacement therapy.  History of endometrial polyp excised by hysteroscopy in 2014.  We will follow-up for a pelvic ultrasound to further investigate with possible endometrial biopsy.  Patient voiced understanding and agreement with plan. - US Transvaginal Non-OB; Future  2. Right lower quadrant abdominal pain Right lower quadrant crampy pain since started spotting vaginally.  No adnexal mass  felt on exam today.  We will complete the investigation with a pelvic ultrasound at follow-up. - US Transvaginal Non-OB; Future  3. Screening for malignant neoplasm of cervix Last Pap test in 2013.  Repeat Pap test reflex today. - Pap IG w/ reflex to HPV when ASC-U  Other orders - Multiple Vitamin (MULTIVITAMIN) tablet; Take 1 tablet by mouth daily. - cholecalciferol (VITAMIN D3) 25 MCG (1000 UT) tablet; Take 1,000 Units by mouth daily.  Counseling on above issues and coordination of care more than 50% for 30 minutes.  Princess Bruins MD, 2:30 PM 07/21/2019

## 2019-07-21 NOTE — Patient Instructions (Signed)
1. Postmenopausal bleeding Postmenopausal spotting for 2 weeks associated with right pelvic cramping.  Not on any hormone replacement therapy.  History of endometrial polyp excised by hysteroscopy in 2014.  We will follow-up for a pelvic ultrasound to further investigate with possible endometrial biopsy.  Patient voiced understanding and agreement with plan. - US Transvaginal Non-OB; Future  2. Right lower quadrant abdominal pain Right lower quadrant crampy pain since started spotting vaginally.  No adnexal mass felt on exam today.  We will complete the investigation with a pelvic ultrasound at follow-up. - US Transvaginal Non-OB; Future  3. Screening for malignant neoplasm of cervix Last Pap test in 2013.  Repeat Pap test reflex today. - Pap IG w/ reflex to HPV when ASC-U  Other orders - Multiple Vitamin (MULTIVITAMIN) tablet; Take 1 tablet by mouth daily. - cholecalciferol (VITAMIN D3) 25 MCG (1000 UT) tablet; Take 1,000 Units by mouth daily.  Naria, it was a pleasure seeing you today!  I will inform you of your results as soon as they are available.

## 2019-07-22 LAB — PAP IG W/ RFLX HPV ASCU

## 2019-07-26 DIAGNOSIS — M19041 Primary osteoarthritis, right hand: Secondary | ICD-10-CM | POA: Diagnosis not present

## 2019-07-26 DIAGNOSIS — M7542 Impingement syndrome of left shoulder: Secondary | ICD-10-CM | POA: Diagnosis not present

## 2019-07-26 DIAGNOSIS — M19042 Primary osteoarthritis, left hand: Secondary | ICD-10-CM | POA: Diagnosis not present

## 2019-07-26 DIAGNOSIS — M6530 Trigger finger, unspecified finger: Secondary | ICD-10-CM | POA: Diagnosis not present

## 2019-08-23 ENCOUNTER — Other Ambulatory Visit: Payer: Self-pay

## 2019-08-24 ENCOUNTER — Ambulatory Visit (INDEPENDENT_AMBULATORY_CARE_PROVIDER_SITE_OTHER): Payer: BC Managed Care – PPO

## 2019-08-24 ENCOUNTER — Encounter: Payer: Self-pay | Admitting: Obstetrics & Gynecology

## 2019-08-24 ENCOUNTER — Telehealth: Payer: Self-pay

## 2019-08-24 ENCOUNTER — Ambulatory Visit: Payer: BC Managed Care – PPO | Admitting: Obstetrics & Gynecology

## 2019-08-24 VITALS — BP 140/88

## 2019-08-24 DIAGNOSIS — N840 Polyp of corpus uteri: Secondary | ICD-10-CM

## 2019-08-24 DIAGNOSIS — N950 Postmenopausal bleeding: Secondary | ICD-10-CM | POA: Diagnosis not present

## 2019-08-24 DIAGNOSIS — R1031 Right lower quadrant pain: Secondary | ICD-10-CM | POA: Diagnosis not present

## 2019-08-24 NOTE — Progress Notes (Signed)
    Heather Lambert [facility] 05-28-1956 [id number]        64 y.o.  G0   RP: PMB for Pelvic US  HPI: No change x 07/21/2019:  Patient last seen by myself in 08/2012 when a [facility] Excision/D+C was done.  Patho showed a benign endometrial polyp and benign endometrium.  Postmenopausal on no HRT with no PMB from that time until early 07/2019.  Had mild vaginal spotting with crampy discomfort towards the Rt pelvis x 2 weeks in early to mid 07/2019.   OB History  Gravida Para Term Preterm AB Living  0 0 0 0 0 0  SAB TAB Ectopic Multiple Live Births  0 0 0 0 0    Past medical history,surgical history, problem list, medications, allergies, family history and social history were all reviewed and documented in the EPIC chart.   Directed ROS with pertinent positives and negatives documented in the history of present illness/assessment and plan.  Exam:  Vitals:   08/24/19 0913  BP: 140/88   General appearance:  Normal  Pelvic US today: T/V images.  Small uterus in the mid plane position measuring 4.24 x 3.15 x 2.13 cm.  Bicornuate uterus.  No myometrial mass.  Slightly thickened endometrial lining at 4.72 mm.  Focal echogenicity measured at 4 x 3 mm.  3D images suggest a small intracavitary mass compatible with a polyp.  Both ovaries are small with atrophic appearance.  No adnexal mass.  No free fluid in the posterior cul-de-sac.   Assessment/Plan:  64 y.o. G0P0000   1. Postmenopausal bleeding Pelvic ultrasound today reviewed with patient, revealing a normal small uterus with a mild arcuate shape.  The endometrial lining was slightly thickened at 4.72 mm with a focal echogenicity measuring 4 x 3 mm per 3D imaging compatible with a small endometrial polyp.  PMB probably due to the Endometrial Polyp.  Decision to proceed with [facility] Myosure Excision/D+C.  Surgery discussed with patient, pamphlet given.  F/U Preop.  2. Endometrial polyp As above.  Genia Del MD, 9:44 AM 08/24/2019

## 2019-08-24 NOTE — Telephone Encounter (Signed)
I spoke with patient about scheduling surgery. We reviewed her insurance benefits and potiential dates available. Also, advised her about need for Covid screen and quarantine protocol pre operatively.  She asked about postponing and how would Dr. Seymour Bars feel about it. I advised her that a couple of months would likely be okay but the disclaimer that it is likely benign but no way to guarantee without removing the polyp/lining and letting pathology examine it microscopically. She wants to consider some things and I gave her my direct phone number to call me when she is ready to schedule surgery.

## 2019-08-29 ENCOUNTER — Encounter: Payer: Self-pay | Admitting: Obstetrics & Gynecology

## 2019-08-29 NOTE — Patient Instructions (Signed)
1. Postmenopausal bleeding Pelvic ultrasound today reviewed with patient, revealing a normal small uterus with a mild arcuate shape.  The endometrial lining was slightly thickened at 4.72 mm with a focal echogenicity measuring 4 x 3 mm per 3D imaging compatible with a small endometrial polyp. PMB probably due to the Endometrial Polyp.  Decision to proceed with [facility] Myosure Excision/D+C.  Surgery discussed with patient, pamphlet given.  F/U Preop.  2. Endometrial polyp As above.  Abrey, it was a pleasure seeing you today!

## 2019-08-31 ENCOUNTER — Telehealth: Payer: Self-pay

## 2019-08-31 NOTE — Telephone Encounter (Signed)
Patient called because she is scheduled for CE in Feb but when she had a problem visit in December Dr. Seymour Bars did pelvic and pap smear and she is questioning does she need CE appt. We discussed that no breast exam was done and that is recommended. She thinks her insurance covers her preventative visits 100%. She is going to check and if so she is going to keep that appointment.

## 2019-09-29 ENCOUNTER — Other Ambulatory Visit: Payer: Self-pay

## 2019-09-30 ENCOUNTER — Encounter: Payer: Self-pay | Admitting: Obstetrics & Gynecology

## 2019-09-30 ENCOUNTER — Ambulatory Visit: Payer: BC Managed Care – PPO | Admitting: Obstetrics & Gynecology

## 2019-09-30 VITALS — BP 132/86 | Ht 61.0 in | Wt 174.0 lb

## 2019-09-30 DIAGNOSIS — E6609 Other obesity due to excess calories: Secondary | ICD-10-CM | POA: Diagnosis not present

## 2019-09-30 DIAGNOSIS — Z01419 Encounter for gynecological examination (general) (routine) without abnormal findings: Secondary | ICD-10-CM | POA: Diagnosis not present

## 2019-09-30 DIAGNOSIS — N840 Polyp of corpus uteri: Secondary | ICD-10-CM

## 2019-09-30 DIAGNOSIS — Z6832 Body mass index (BMI) 32.0-32.9, adult: Secondary | ICD-10-CM | POA: Diagnosis not present

## 2019-09-30 DIAGNOSIS — Z780 Asymptomatic menopausal state: Secondary | ICD-10-CM | POA: Diagnosis not present

## 2019-09-30 NOTE — Progress Notes (Signed)
Heather Lambert [facility] 1956-02-10 [id number]   History:    64 y.o.  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  No PMB.  [place] Excision/D+C 08/2012.  Patho showed a benign endometrial polyp and benign endometrium.  Postmenopausal bleeding again 07/2019.  Pelvic US 08/24/19 IU lesion c/w an endometrial polyp 4 x 3 mm.   Urine and BMs normal.  Abstinent.  Pap 07/21/2019 Negative.  Mammo 05/2019 negative.  Fasting Health Labs with Fam MD.  Had bilateral knee replacement in 2019.  Past medical history,surgical history, family history and social history were all reviewed and documented in the EPIC chart.  Gynecologic History No LMP recorded. Patient is postmenopausal.  Obstetric History OB History  Gravida Para Term Preterm AB Living  0 0 0 0 0 0  SAB TAB Ectopic Multiple Live Births  0 0 0 0 0     ROS: A ROS was performed and pertinent positives and negatives are included in the history.  GENERAL: No fevers or chills. HEENT: No change in vision, no earache, sore throat or sinus congestion. NECK: No pain or stiffness. CARDIOVASCULAR: No chest pain or pressure. No palpitations. PULMONARY: No shortness of breath, cough or wheeze. GASTROINTESTINAL: No abdominal pain, nausea, vomiting or diarrhea, melena or bright red blood per rectum. GENITOURINARY: No urinary frequency, urgency, hesitancy or dysuria. MUSCULOSKELETAL: No joint or muscle pain, no back pain, no recent trauma. DERMATOLOGIC: No rash, no itching, no lesions. ENDOCRINE: No polyuria, polydipsia, no heat or cold intolerance. No recent change in weight. HEMATOLOGICAL: No anemia or easy bruising or bleeding. NEUROLOGIC: No headache, seizures, numbness, tingling or weakness. PSYCHIATRIC: No depression, no loss of interest in normal activity or change in sleep pattern.     Exam:   BP 132/86   Ht 5\' 1"  (1.549 m)   Wt 174 lb (78.9 kg)   BMI 32.88 kg/m    Body mass index is 32.88 kg/m.  General appearance : Well developed well nourished female. No acute distress HEENT: Eyes: no retinal hemorrhage or exudates,  Neck supple, trachea midline, no carotid bruits, no thyroidmegaly Lungs: Clear to auscultation, no rhonchi or wheezes, or rib retractions  Heart: Regular rate and rhythm, no murmurs or gallops Breast:Examined in sitting and supine position were symmetrical in appearance, no palpable masses or tenderness,  no skin retraction, no nipple inversion, no nipple discharge, no skin discoloration, no axillary or supraclavicular lymphadenopathy Abdomen: no palpable masses or tenderness, no rebound or guarding Extremities: no edema or skin discoloration or tenderness  Pelvic: Vulva: Normal             Vagina: No gross lesions or discharge  Cervix: No gross lesions or discharge.    Uterus  AV, normal size, shape and consistency, non-tender and mobile  Adnexa  Without masses or tenderness  Anus: Normal  Pelvic US 08/24/2019: T/V images.  Small uterus in the mid plane position measuring 4.24 x 3.15 x 2.13 cm.  Bicornuate uterus.  No myometrial mass.  Slightly thickened endometrial lining at 4.72 mm.  Focal echogenicity measured at 4 x 3 mm. 3D images suggest a small intracavitary mass compatible with a polyp.  Both ovaries are small with atrophic appearance.  No adnexal mass.  No free fluid in the posterior cul-de-sac.   Assessment/Plan:  64 y.o. female for annual exam   1. Well female exam with routine gynecological exam Normal gynecologic exam  in menopause.  Pap test December 2020 was negative.  Breast exam normal.  Screening mammogram October 2020 was negative.  Colonoscopy 2019.  Health labs with family physician.  2. Postmenopause Well on no hormone replacement therapy.  Had postmenopausal bleeding, but no bleeding since pelvic ultrasound August 24, 2019.  3. Endometrial polyp Small focal area compatible with a polyp on pelvic  ultrasound January 2021.  We will complete the investigation with a sonohysterogram. - [place] Sonohysterogram; Future  4. Class 1 obesity due to excess calories with serious comorbidity and body mass index (BMI) of 32.0 to 32.9 in adult Recommend a lower calorie/carb diet such as [employer].  Aerobic physical activities 5 times a week and light weightlifting every 2 days.  Genia Del MD, 11:56 AM 09/30/2019

## 2019-10-02 ENCOUNTER — Encounter: Payer: Self-pay | Admitting: Obstetrics & Gynecology

## 2019-10-02 NOTE — Patient Instructions (Signed)
1. Well female exam with routine gynecological exam Normal gynecologic exam in menopause.  Pap test December 2020 was negative.  Breast exam normal.  Screening mammogram October 2020 was negative.  Colonoscopy 2019.  Health labs with family physician.  2. Postmenopause Well on no hormone replacement therapy.  Had postmenopausal bleeding, but no bleeding since pelvic ultrasound August 24, 2019.  3. Endometrial polyp Small focal area compatible with a polyp on pelvic ultrasound January 2021.  We will complete the investigation with a sonohysterogram. - [place] Sonohysterogram; Future  4. Class 1 obesity due to excess calories with serious comorbidity and body mass index (BMI) of 32.0 to 32.9 in adult Recommend a lower calorie/carb diet such as [employer].  Aerobic physical activities 5 times a week and light weightlifting every 2 days.  Heather Lambert, it was a pleasure seeing you today!

## 2019-10-25 DIAGNOSIS — M7542 Impingement syndrome of left shoulder: Secondary | ICD-10-CM | POA: Diagnosis not present

## 2019-10-25 DIAGNOSIS — M6530 Trigger finger, unspecified finger: Secondary | ICD-10-CM | POA: Diagnosis not present

## 2019-10-25 DIAGNOSIS — G4700 Insomnia, unspecified: Secondary | ICD-10-CM | POA: Diagnosis not present

## 2019-10-25 DIAGNOSIS — M1990 Unspecified osteoarthritis, unspecified site: Secondary | ICD-10-CM | POA: Diagnosis not present

## 2019-12-27 DIAGNOSIS — M1990 Unspecified osteoarthritis, unspecified site: Secondary | ICD-10-CM | POA: Diagnosis not present

## 2019-12-27 DIAGNOSIS — I10 Essential (primary) hypertension: Secondary | ICD-10-CM | POA: Diagnosis not present

## 2019-12-27 DIAGNOSIS — G4700 Insomnia, unspecified: Secondary | ICD-10-CM | POA: Diagnosis not present

## 2020-02-13 ENCOUNTER — Other Ambulatory Visit: Payer: Self-pay

## 2020-02-13 ENCOUNTER — Encounter (HOSPITAL_COMMUNITY): Payer: Self-pay | Admitting: Emergency Medicine

## 2020-02-13 ENCOUNTER — Ambulatory Visit (HOSPITAL_COMMUNITY)
Admission: EM | Admit: 2020-02-13 | Discharge: 2020-02-13 | Disposition: A | Discharge status: Home / Self Care | Payer: BC Managed Care – PPO | Attending: Family Medicine | Admitting: Family Medicine

## 2020-02-13 DIAGNOSIS — W57XXXA Bitten or stung by nonvenomous insect and other nonvenomous arthropods, initial encounter: Secondary | ICD-10-CM

## 2020-02-13 DIAGNOSIS — S90562A Insect bite (nonvenomous), left ankle, initial encounter: Secondary | ICD-10-CM | POA: Diagnosis not present

## 2020-02-13 MED ORDER — CEPHALEXIN 500 MG PO CAPS: 500.0000 mg | ORAL_CAPSULE | Freq: Three times a day (TID) | ORAL | 0 refills | Status: AC

## 2020-02-13 NOTE — Discharge Instructions (Signed)
Continue with topical hydrocortisone to help with swelling and itching. Regular antihistamine- benadryl or claritin or zyrtec 1 week of antibiotics.  If symptoms worsen or do not improve in the next week to return to be seen or to follow up with your PCP.

## 2020-02-13 NOTE — ED Provider Notes (Signed)
[facility]    CSN: [id number] Arrival date & time: 02/13/20  1329      History   Chief Complaint Chief Complaint  Patient presents with   Insect Bite    HPI Carri Spillers is a 64 y.o. female.   Zannie Kehr presents with complaints of presumed bug bite to left lateral ankle. Two nights ago noted some swelling to left medial ankle, she works outside regularly as well as has been renovating an old home. Yesterday morning the swelling had gone down and noted redness and swelling to her lateral ankle with two apparent puncture wounds. She took benadryl and applied cortisone and ice. Swelling has improved. Still with occasional itching and some mild burning sensation. Not worsening. Denies any previous similar. Medial aspect of ankle/ foot has resolved and is isolated to lateral aspect.    ROS per HPI, negative if not otherwise mentioned.      Past Medical History:  Diagnosis Date   Allergy 1995   enviromental food, grasses, smoke   Arthritis    knees   Complication of anesthesia    versed not reacting well per M.D.   GERD (gastroesophageal reflux disease)    occasional   Heart murmur    hx of  ??   History of kidney stones    Hyperlipidemia    Hypertension    Pneumonia    walking pneumonia    Patient Active Problem List   Diagnosis Date Noted   OA (osteoarthritis) of knee 02/10/2017   Allergy    Hypertension    Hyperlipidemia     Past Surgical History:  Procedure Laterality Date   DILATION AND CURETTAGE OF UTERUS  2014   removal of benign polyps   EYE SURGERY  2012   laser to repair torn retina   LITHOTRIPSY  2010   TONSILLECTOMY  1965   TONSILLECTOMY AND ADENOIDECTOMY  1965   TOTAL KNEE ARTHROPLASTY     Left 02/10/17 Dr. Lequita Halt   TOTAL KNEE ARTHROPLASTY Left 02/10/2017   Procedure: LEFT TOTAL KNEE ARTHROPLASTY;  Surgeon: Ollen Gross, MD;  Location: [facility];  Service: Orthopedics;  Laterality: Left;  Adductor  Block   TOTAL KNEE ARTHROPLASTY Right 10/27/2017   Procedure: RIGHT TOTAL KNEE ARTHROPLASTY;  Surgeon: Ollen Gross, MD;  Location: [facility];  Service: Orthopedics;  Laterality: Right;   URETEROSCOPY  2010    OB History    Gravida  0   Para  0   Term  0   Preterm  0   AB  0   Living  0     SAB  0   TAB  0   Ectopic  0   Multiple  0   Live Births  0            Home Medications    Prior to Admission medications   Medication Sig Start Date End Date Taking? Authorizing Provider  cephALEXin (KEFLEX) 500 MG capsule Take 1 capsule (500 mg total) by mouth 3 (three) times daily for 7 days. 02/13/20 02/20/20  Georgetta Haber, NP  cholecalciferol (VITAMIN D3) 25 MCG (1000 UT) tablet Take 1,000 Units by mouth daily.    [provider]  Multiple Vitamin (MULTIVITAMIN) tablet Take 1 tablet by mouth daily.    [provider]  olmesartan (BENICAR) 40 MG tablet Take 40 mg by mouth at bedtime.    [provider]  rosuvastatin (CRESTOR) 5 MG tablet Take 5 mg by mouth  at bedtime. 12/16/16   [provider]    Family History Family History  Problem Relation Age of Onset   Arthritis Mother    COPD Mother    Depression Mother    Hyperlipidemia Mother    Hypertension Mother    Allergic rhinitis Mother    Cancer Father    Hyperlipidemia Father    Hyperlipidemia Brother    Hypertension Brother    Cancer Maternal Grandmother    Alcohol abuse Maternal Grandfather    Stroke Maternal Grandfather    Cancer Paternal Grandmother    Dementia Paternal Grandmother    Stroke Paternal Grandfather    Asthma Neg Hx    Breast cancer Neg Hx     Social History Social History   Tobacco Use   Smoking status: Never Smoker   Smokeless tobacco: Never Used  Building services engineer Use: Never used  Substance Use Topics   Alcohol use: Yes    Alcohol/week: 1.0 standard drink    Types: 1 Glasses of wine per week    Comment: socially     Drug use: No     Allergies   Midazolam and Nickel   Review of Systems Review of Systems   Physical Exam Triage Vital Signs ED Triage Vitals  Enc Vitals Group     BP 02/13/20 1341 (!) 159/96     Pulse Rate 02/13/20 1341 77     Resp 02/13/20 1341 18     Temp 02/13/20 1341 98.3 F (36.8 C)     Temp src --      SpO2 02/13/20 1341 99 %     Weight --      Height --      Head Circumference --      Peak Flow --      Pain Score 02/13/20 1343 3     Pain Loc --      Pain Edu? --      Excl. in GC? --    No data found.  Updated Vital Signs BP (!) 159/96 (BP Location: Right Arm)    Pulse 77    Temp 98.3 F (36.8 C)    Resp 18    SpO2 99%   Visual Acuity Right Eye Distance:   Left Eye Distance:   Bilateral Distance:    Right Eye Near:   Left Eye Near:    Bilateral Near:     Physical Exam Constitutional:      General: She is not in acute distress.    Appearance: She is well-developed.  Cardiovascular:     Rate and Rhythm: Normal rate.  Pulmonary:     Effort: Pulmonary effort is normal.  Musculoskeletal:       Feet:  Feet:     Comments: Left lateral ankle with circular region of redness, blanches, some raised areas; point tenderness near the visible two puncture wounds; no active drainage; no fluctuance; see photo  Skin:    General: Skin is warm and dry.  Neurological:     Mental Status: She is alert and oriented to person, place, and time.        UC Treatments / Results  Labs (all labs ordered are listed, but only abnormal results are displayed) Labs Reviewed - No data to display  EKG   Radiology No results found.  Procedures Procedures (including critical care time)  Medications Ordered in UC Medications - No data to display  Initial Impression / Assessment and Plan / UC Course  I have reviewed the triage vital signs and the nursing notes.  Pertinent labs & imaging results that were available during my care of the patient were reviewed by  me and considered in my medical decision making (see chart for details).     Presumed reaction to insect bite. Swelling has improved, still with pain and itching. Patient is concerned about infection risk, with significance of redness and reaction have initiated keflex. Continue with topical hydrocortisone and antihistamine. Return precautions provided. Patient verbalized understanding and agreeable to plan.  Ambulatory out of clinic without difficulty.   Final Clinical Impressions(s) / UC Diagnoses   Final diagnoses:  Insect bite of left ankle, initial encounter     Discharge Instructions     Continue with topical hydrocortisone to help with swelling and itching. Regular antihistamine- benadryl or claritin or zyrtec 1 week of antibiotics.  If symptoms worsen or do not improve in the next week to return to be seen or to follow up with your PCP.     ED Prescriptions    Medication Sig Dispense Auth. Provider   cephALEXin (KEFLEX) 500 MG capsule Take 1 capsule (500 mg total) by mouth 3 (three) times daily for 7 days. 21 capsule Georgetta Haber, NP     PDMP not reviewed this encounter.   Georgetta Haber, NP 02/13/20 1444

## 2020-02-13 NOTE — ED Triage Notes (Addendum)
Pt present a insect bite on Friday that has swollen her left ankle/ foot and red with two small holes on in the red area

## 2020-02-16 DIAGNOSIS — E663 Overweight: Secondary | ICD-10-CM | POA: Diagnosis not present

## 2020-02-16 DIAGNOSIS — G4700 Insomnia, unspecified: Secondary | ICD-10-CM | POA: Diagnosis not present

## 2020-02-16 DIAGNOSIS — I10 Essential (primary) hypertension: Secondary | ICD-10-CM | POA: Diagnosis not present

## 2020-05-04 ENCOUNTER — Other Ambulatory Visit: Payer: Self-pay | Admitting: Family Medicine

## 2020-05-04 DIAGNOSIS — Z1231 Encounter for screening mammogram for malignant neoplasm of breast: Secondary | ICD-10-CM

## 2020-05-08 DIAGNOSIS — Z1159 Encounter for screening for other viral diseases: Secondary | ICD-10-CM | POA: Diagnosis not present

## 2020-05-08 DIAGNOSIS — E785 Hyperlipidemia, unspecified: Secondary | ICD-10-CM | POA: Diagnosis not present

## 2020-05-08 DIAGNOSIS — Z0000 Encounter for general adult medical examination without abnormal findings: Secondary | ICD-10-CM | POA: Diagnosis not present

## 2020-05-10 DIAGNOSIS — I10 Essential (primary) hypertension: Secondary | ICD-10-CM | POA: Diagnosis not present

## 2020-05-10 DIAGNOSIS — Z0000 Encounter for general adult medical examination without abnormal findings: Secondary | ICD-10-CM | POA: Diagnosis not present

## 2020-05-10 DIAGNOSIS — Z1211 Encounter for screening for malignant neoplasm of colon: Secondary | ICD-10-CM | POA: Diagnosis not present

## 2020-05-10 DIAGNOSIS — Z23 Encounter for immunization: Secondary | ICD-10-CM | POA: Diagnosis not present

## 2020-05-11 ENCOUNTER — Ambulatory Visit: Payer: BC Managed Care – PPO | Admitting: Obstetrics & Gynecology

## 2020-05-11 ENCOUNTER — Other Ambulatory Visit: Payer: BC Managed Care – PPO

## 2020-05-19 ENCOUNTER — Ambulatory Visit: Payer: BC Managed Care – PPO

## 2020-06-27 DIAGNOSIS — H43813 Vitreous degeneration, bilateral: Secondary | ICD-10-CM | POA: Diagnosis not present

## 2020-06-28 ENCOUNTER — Other Ambulatory Visit: Payer: Self-pay

## 2020-06-28 ENCOUNTER — Ambulatory Visit
Admission: RE | Admit: 2020-06-28 | Discharge: 2020-06-28 | Disposition: A | Discharge status: Home / Self Care | Payer: BC Managed Care – PPO | Source: Ambulatory Visit | Attending: Family Medicine | Admitting: Family Medicine

## 2020-06-28 DIAGNOSIS — Z1231 Encounter for screening mammogram for malignant neoplasm of breast: Secondary | ICD-10-CM

## 2020-08-03 ENCOUNTER — Ambulatory Visit: Payer: BC Managed Care – PPO | Admitting: Obstetrics & Gynecology

## 2020-08-03 ENCOUNTER — Other Ambulatory Visit: Payer: BC Managed Care – PPO

## 2020-08-10 ENCOUNTER — Other Ambulatory Visit: Payer: BC Managed Care – PPO

## 2020-08-10 ENCOUNTER — Ambulatory Visit: Payer: BC Managed Care – PPO | Admitting: Obstetrics & Gynecology

## 2020-08-31 ENCOUNTER — Other Ambulatory Visit: Payer: Self-pay | Admitting: Obstetrics & Gynecology

## 2020-08-31 ENCOUNTER — Ambulatory Visit (INDEPENDENT_AMBULATORY_CARE_PROVIDER_SITE_OTHER): Payer: 59

## 2020-08-31 ENCOUNTER — Encounter: Payer: Self-pay | Admitting: Obstetrics & Gynecology

## 2020-08-31 ENCOUNTER — Ambulatory Visit (INDEPENDENT_AMBULATORY_CARE_PROVIDER_SITE_OTHER): Payer: 59 | Admitting: Obstetrics & Gynecology

## 2020-08-31 ENCOUNTER — Other Ambulatory Visit: Payer: Self-pay

## 2020-08-31 VITALS — BP 150/98 | HR 80 | Resp 20

## 2020-08-31 DIAGNOSIS — N840 Polyp of corpus uteri: Secondary | ICD-10-CM

## 2020-08-31 NOTE — Progress Notes (Signed)
    Heather Lambert [facility] 11-25-1955 [id number]        65 y.o.  G0  RP: PMB/Thickened Endometrial line for Pelvic US/Sonohysto  HPI: Had PMB 07/2019, a Pelvic US was done 08/2019 showing a thickened endometrial line.  Patient didn't f/u until now.  No HRT.  No PMB since 07/2019.  No pelvic pain.   OB History  Gravida Para Term Preterm AB Living  0 0 0 0 0 0  SAB IAB Ectopic Multiple Live Births  0 0 0 0 0    Past medical history,surgical history, problem list, medications, allergies, family history and social history were all reviewed and documented in the EPIC chart.   Directed ROS with pertinent positives and negatives documented in the history of present illness/assessment and plan.  Exam:  Vitals:   08/31/20 1157  BP: (!) 150/98  Pulse: 80  Resp: 20   General appearance:  Normal  Pelvic US today: T/V images.  Small uterus measured at 4.35 x 2.64 x 2.37 cm.  No myometrial mass seen.  Thickened endometrial lining measured at 4.62 mm.  3D imaging again suggests intracavitary mass slightly enlarged compared to January 2021 today at 8 x 5 mm.  Both ovaries are small with atrophic appearance.  No adnexal mass.  No free fluid in the posterior cul-de-sac.   Assessment/Plan:  65 y.o. G0P0000   1. Endometrial polyp Probable benign endometrial polyp.  Very mild increase in size compared with pelvic ultrasound a year ago.  No postmenopausal bleeding since then.  Decision to proceed with HSC/Myosure excision/D+C.  Prefers in June 2022.  Hysteroscopy pamphlet given.  Follow-up preop, could do video visit.  Genia Del MD, 11:59 AM 08/31/2020

## 2020-09-08 ENCOUNTER — Telehealth: Payer: Self-pay

## 2020-09-08 NOTE — Telephone Encounter (Signed)
-----   Message from Keenan Bachelor, [place] sent at 09/04/2020  4:11 PM EST ----- Regarding: FW: surgery for June with ML  ----- Message ----- From: Keenan Bachelor, RMA Sent: 08/31/2020   2:08 PM EST To: Webb Silversmith Subject: surgery for June with ML                       Dr. Seymour Bars:  Hysteroscopy, Myosure excision, D+C   ( CPT [phone]   ICD 10:  N84.0)  Wants surgery in June 2022   [facility]   45 minutes   Preop can be in May with Video visit

## 2020-10-05 ENCOUNTER — Encounter: Payer: Self-pay | Admitting: Obstetrics & Gynecology

## 2020-10-05 ENCOUNTER — Ambulatory Visit (INDEPENDENT_AMBULATORY_CARE_PROVIDER_SITE_OTHER): Payer: 59 | Admitting: Obstetrics & Gynecology

## 2020-10-05 ENCOUNTER — Other Ambulatory Visit: Payer: Self-pay

## 2020-10-05 VITALS — BP 130/80 | Ht 63.0 in | Wt 174.0 lb

## 2020-10-05 DIAGNOSIS — E6609 Other obesity due to excess calories: Secondary | ICD-10-CM

## 2020-10-05 DIAGNOSIS — N840 Polyp of corpus uteri: Secondary | ICD-10-CM

## 2020-10-05 DIAGNOSIS — Z780 Asymptomatic menopausal state: Secondary | ICD-10-CM | POA: Diagnosis not present

## 2020-10-05 DIAGNOSIS — Z01419 Encounter for gynecological examination (general) (routine) without abnormal findings: Secondary | ICD-10-CM

## 2020-10-05 DIAGNOSIS — Z6830 Body mass index (BMI) 30.0-30.9, adult: Secondary | ICD-10-CM

## 2020-10-05 NOTE — Progress Notes (Signed)
Heather Lambert [facility] 1955/08/23 [id number]   History:    65 y.o. G0 Photographer/Owner of a studio  OQ:[id number] patient presenting for annual gyn exam   WSF:[id number], well on no HRT.  No PMB since last visit 08/2020.  IU lesion c/w an endometrial polyp 8 x 5 mm on last Pelvic US 08/31/2020.  Urine and BMs normal. Abstinent. Pap 07/21/2019 Negative. Mammo 09/2020 negative. Fasting Health Labs with Fam MD. Had bilateral knee replacement in 2019.  BMI 30.82.  Past medical history,surgical history, family history and social history were all reviewed and documented in the EPIC chart.  Gynecologic History No LMP recorded. Patient is postmenopausal.   Obstetric History OB History  Gravida Para Term Preterm AB Living  0 0 0 0 0 0  SAB IAB Ectopic Multiple Live Births  0 0 0 0 0     ROS: A ROS was performed and pertinent positives and negatives are included in the history.  GENERAL: No fevers or chills. HEENT: No change in vision, no earache, sore throat or sinus congestion. NECK: No pain or stiffness. CARDIOVASCULAR: No chest pain or pressure. No palpitations. PULMONARY: No shortness of breath, cough or wheeze. GASTROINTESTINAL: No abdominal pain, nausea, vomiting or diarrhea, melena or bright red blood per rectum. GENITOURINARY: No urinary frequency, urgency, hesitancy or dysuria. MUSCULOSKELETAL: No joint or muscle pain, no back pain, no recent trauma. DERMATOLOGIC: No rash, no itching, no lesions. ENDOCRINE: No polyuria, polydipsia, no heat or cold intolerance. No recent change in weight. HEMATOLOGICAL: No anemia or easy bruising or bleeding. NEUROLOGIC: No headache, seizures, numbness, tingling or weakness. PSYCHIATRIC: No depression, no loss of interest in normal activity or change in sleep pattern.     Exam:   BP 130/80   Ht 5\' 3"  (1.6 m)   Wt 174 lb (78.9 kg)   BMI 30.82 kg/m   Body mass index is 30.82 kg/m.  General appearance : Well developed well nourished  female. No acute distress HEENT: Eyes: no retinal hemorrhage or exudates,  Neck supple, trachea midline, no carotid bruits, no thyroidmegaly Lungs: Clear to auscultation, no rhonchi or wheezes, or rib retractions  Heart: Regular rate and rhythm, no murmurs or gallops Breast:Examined in sitting and supine position were symmetrical in appearance, no palpable masses or tenderness,  no skin retraction, no nipple inversion, no nipple discharge, no skin discoloration, no axillary or supraclavicular lymphadenopathy Abdomen: no palpable masses or tenderness, no rebound or guarding Extremities: no edema or skin discoloration or tenderness  Pelvic: Vulva: Normal             Vagina: No gross lesions or discharge  Cervix: No gross lesions or discharge  Uterus  AV, normal size, shape and consistency, non-tender and mobile  Adnexa  Without masses or tenderness  Anus: Normal  Pelvic 08/2020: T/V images. Small uterus measured at 4.35 x 2.64 x 2.37 cm. No myometrial mass seen. Thickened endometrial lining measured at 4.62 mm. 3D imaging again suggests intracavitary mass slightly enlarged compared to January 2021 today at 8 x 5 mm. Both ovaries are small with atrophic appearance. No adnexal mass. No free fluid in the posterior cul-de-sac.   Assessment/Plan:  65 y.o. female for annual exam   1. Well female exam with routine gynecological exam Normal gynecologic exam in menopause.  No indication for a pap test this year, negative in 2020.  Breast exam normal.  Screening mammo neg 06/2020.  Health labs with Fam MD.   2. Postmenopause Well on  no HRT.  No PMB.  3. Endometrial polyp 8 mm endometrial Polyp per Pelvic US.  Will proceed with HSC/Excision with Myosure/D+C.  Schedule surgery.  4. Class 1 obesity due to excess calories with serious comorbidity and body mass index (BMI) of 30.0 to 30.9 in adult Low calorie/carb diet.  Increase fitness activities after knee replacements.  Other orders -  hydrochlorothiazide (MICROZIDE) 12.5 MG capsule; Take 12.5 mg by mouth daily.  Genia Del MD, 11:09 AM 10/05/2020

## 2020-10-09 ENCOUNTER — Encounter: Payer: Self-pay | Admitting: Obstetrics & Gynecology

## 2020-12-06 NOTE — Telephone Encounter (Signed)
I called patient to see if she is ready to schedule surgery in June.  She said she has a big house project and will need to push it to July. I told her I will call her when I receive July schedule.

## 2021-01-02 ENCOUNTER — Telehealth: Payer: Self-pay

## 2021-01-02 NOTE — Telephone Encounter (Signed)
Per Dr. Seymour Bars:  Hysteroscopy, Myosure Excision D&C, CPT [phone]  [facility]  45 minutes  Needs pre op visit.  I had received this back in January and patient wanted June  And then decided she wanted to schedule in July.  I told her you will call her when July schedule is available.

## 2021-01-05 NOTE — Telephone Encounter (Signed)
Left message to call Zalika Tieszen, RN at [facility], [phone].  

## 2021-01-09 NOTE — Telephone Encounter (Signed)
Spoke with patient.  Patient is working on Training and development officer a house, declines to schedule surgery right now, will consider August 2022.  Will keep surgery information, patient will return call later in July to advise how she would like to proceed. Advised patient I will update Dr. Seymour Bars and return call if any additional recommendations. Patient agreeable.   Routing to provider for final review. Patient is agreeable to disposition. Will close encounter.  Cc: Heather Lambert

## 2021-01-09 NOTE — Telephone Encounter (Signed)
Left message to call Wyn Nettle, RN at [facility], [phone].  

## 2021-04-23 ENCOUNTER — Telehealth: Payer: Self-pay | Admitting: *Deleted

## 2021-04-23 NOTE — Telephone Encounter (Signed)
Left message to call Noreene Larsson, RN at [facility], [phone].   F/u on Hysteroscopy Myosure D&C for endometrial polyp. Last OV 08/31/20. Patient previously deferred until 03/2021.

## 2021-05-24 ENCOUNTER — Other Ambulatory Visit: Payer: Self-pay | Admitting: Family Medicine

## 2021-05-24 DIAGNOSIS — Z1231 Encounter for screening mammogram for malignant neoplasm of breast: Secondary | ICD-10-CM

## 2021-06-11 NOTE — Telephone Encounter (Signed)
Left message to call Antasia Haider, RN at [facility], [phone].  

## 2021-06-11 NOTE — Telephone Encounter (Signed)
Spoke with patient. Patient declines to proceed with surgery at this time due to her work schedule. States her insurance may change the first of the year, would like to return call in 08/2021 to review new benefits and schedule. Patient denies any bleeding. Advised patient I will update provider and return call if any additional recommendations. Patient agreeable.   Dr. Seymour Bars -please review.   Cc: Hayley Carder

## 2021-07-04 ENCOUNTER — Ambulatory Visit (INDEPENDENT_AMBULATORY_CARE_PROVIDER_SITE_OTHER): Payer: 59

## 2021-07-04 ENCOUNTER — Other Ambulatory Visit: Payer: Self-pay

## 2021-07-04 DIAGNOSIS — Z1231 Encounter for screening mammogram for malignant neoplasm of breast: Secondary | ICD-10-CM

## 2021-07-05 NOTE — Telephone Encounter (Signed)
Will hold surgery chart and f/u with patient in 08/2021.   Encounter closed.

## 2021-08-15 ENCOUNTER — Telehealth: Payer: Self-pay

## 2021-08-15 NOTE — Telephone Encounter (Signed)
Spoke with patient regarding proceeding with recommended surgery with Dr. Dellis Filbert. Patient stated that she is ready to proceed, but will have new insurance in May. Patient stated that she would like to know the price difference to be able to determine if she would like to have it before or after May. Informed patient that I would get estimates for each and return her call. Patient is agreeable and appreciative.  Routing to Glorianne Manchester, Therapist, sports as Juluis Rainier.

## 2021-08-16 NOTE — Telephone Encounter (Signed)
Call to patient. Per DPR, OK to leave message on voicemail.   Left voicemail requesting a return call to review benefits for Recommended Surgery with Marie-Lyne Lavoie, MD, FACOG, FRCSC, MA.  

## 2021-09-03 NOTE — Telephone Encounter (Signed)
Left message to call Sharee Pimple, RN at [facility], [phone].   Last AEX 10/05/20 No AEX scheduled.

## 2021-09-04 NOTE — Telephone Encounter (Signed)
Left message to return call to Latangela Mccomas at [phone], Prompt 3 for Benefits.  °

## 2021-09-04 NOTE — Telephone Encounter (Signed)
Patient returned call and left message to return call to discuss benefits and scheduling.   Routing to [employer]

## 2021-09-11 NOTE — Telephone Encounter (Signed)
Spoke with patient regarding surgery benefits. Patient is requesting to wait to proceed with surgery until May, when she will have new insurance. Informed patient that she is due for her annual exam. Patient scheduled annual exam at time of call for 10/15/2021 at 10:30am.  Will route to Carmelina Dane, RN, as Lorain Childes and hold surgery chart until April 2023 to follow up with patient.   Encounter closed.

## 2021-09-11 NOTE — Telephone Encounter (Signed)
Left message to return call to Leisha Trinkle at [phone], Prompt 3 for Benefits.  °

## 2021-10-15 ENCOUNTER — Other Ambulatory Visit: Payer: Self-pay

## 2021-10-15 ENCOUNTER — Ambulatory Visit (INDEPENDENT_AMBULATORY_CARE_PROVIDER_SITE_OTHER): Payer: 59 | Admitting: Obstetrics & Gynecology

## 2021-10-15 ENCOUNTER — Encounter: Payer: Self-pay | Admitting: Obstetrics & Gynecology

## 2021-10-15 VITALS — BP 140/82 | HR 89 | Ht 60.75 in | Wt 177.0 lb

## 2021-10-15 DIAGNOSIS — Z780 Asymptomatic menopausal state: Secondary | ICD-10-CM | POA: Diagnosis not present

## 2021-10-15 DIAGNOSIS — Z01419 Encounter for gynecological examination (general) (routine) without abnormal findings: Secondary | ICD-10-CM | POA: Diagnosis not present

## 2021-10-15 DIAGNOSIS — N840 Polyp of corpus uteri: Secondary | ICD-10-CM | POA: Diagnosis not present

## 2021-10-15 DIAGNOSIS — Z6833 Body mass index (BMI) 33.0-33.9, adult: Secondary | ICD-10-CM

## 2021-10-15 DIAGNOSIS — E6609 Other obesity due to excess calories: Secondary | ICD-10-CM

## 2021-10-15 DIAGNOSIS — Z13820 Encounter for screening for osteoporosis: Secondary | ICD-10-CM | POA: Diagnosis not present

## 2021-10-15 NOTE — Progress Notes (Signed)
? ? ?Heather Lambert 08-21-1955 [id number] ? ? ?History:    66 y.o. G0 Widowed.  Photographer/Owner of a studio ?  ?RP: Established patient presenting for annual gyn exam  ?  ?HPI: Postmenopause, well on no HRT.  No PMB since 08/2020.  IU lesion c/w an endometrial polyp 8 x 5 mm on last Pelvic US 08/31/2020.  Repeat Pelvic US to schedule. Pap normal 07/2019, no h/o abnormal Pap.  Urine and BMs normal. Abstinent.  Mammo 06/2021 negative.  Fasting Health Labs with Fam MD.  Had bilateral knee replacement in 2019.  BMI 33.72.  Will schedule first BD here now. ? ?Past medical history,surgical history, family history and social history were all reviewed and documented in the EPIC chart. ? ?Gynecologic History ?No LMP recorded. Patient is postmenopausal. ? ?Obstetric History ?OB History  ?Gravida Para Term Preterm AB Living  ?0 0 0 0 0 0  ?SAB IAB Ectopic Multiple Live Births  ?0 0 0 0 0  ? ? ? ?ROS: A ROS was performed and pertinent positives and negatives are included in the history. ? GENERAL: No fevers or chills. HEENT: No change in vision, no earache, sore throat or sinus congestion. NECK: No pain or stiffness. CARDIOVASCULAR: No chest pain or pressure. No palpitations. PULMONARY: No shortness of breath, cough or wheeze. GASTROINTESTINAL: No abdominal pain, nausea, vomiting or diarrhea, melena or bright red blood per rectum. GENITOURINARY: No urinary frequency, urgency, hesitancy or dysuria. MUSCULOSKELETAL: No joint or muscle pain, no back pain, no recent trauma. DERMATOLOGIC: No rash, no itching, no lesions. ENDOCRINE: No polyuria, polydipsia, no heat or cold intolerance. No recent change in weight. HEMATOLOGICAL: No anemia or easy bruising or bleeding. NEUROLOGIC: No headache, seizures, numbness, tingling or weakness. PSYCHIATRIC: No depression, no loss of interest in normal activity or change in sleep pattern.  ?  ? ?Exam: ? ? ?BP 140/82   Pulse 89   Ht 5' 0.75" (1.543 m)   Wt 177 lb (80.3 kg)   SpO2 99%    BMI 33.72 kg/m?  ? ?Body mass index is 33.72 kg/m?. ? ?General appearance : Well developed well nourished female. No acute distress ?HEENT: Eyes: no retinal hemorrhage or exudates,  Neck supple, trachea midline, no carotid bruits, no thyroidmegaly ?Lungs: Clear to auscultation, no rhonchi or wheezes, or rib retractions  ?Heart: Regular rate and rhythm, no murmurs or gallops ?Breast:Examined in sitting and supine position were symmetrical in appearance, no palpable masses or tenderness,  no skin retraction, no nipple inversion, no nipple discharge, no skin discoloration, no axillary or supraclavicular lymphadenopathy ?Abdomen: no palpable masses or tenderness, no rebound or guarding ?Extremities: no edema or skin discoloration or tenderness ? ?Pelvic: Vulva: Normal ?            Vagina: No gross lesions or discharge ? Cervix: No gross lesions or discharge ? Uterus  AV, normal size, shape and consistency, non-tender and mobile ? Adnexa  Without masses or tenderness ? Anus: Normal ? ? ?Assessment/Plan:  66 y.o. female for annual exam  ? ?1. Well female exam with routine gynecological exam ?Postmenopause, well on no HRT.  No PMB since 08/2020.  IU lesion c/w an endometrial polyp 8 x 5 mm on last Pelvic US 08/31/2020.  Repeat Pelvic US to schedule. Pap normal 07/2019, no h/o abnormal Pap.  Urine and BMs normal. Abstinent.  Mammo 06/2021 negative.  Fasting Health Labs with Fam MD.  Had bilateral knee replacement in 2019.  BMI 33.72.  Will schedule  first BD here now. ? ?2. Postmenopause ?Postmenopause, well on no HRT.  No PMB since 08/2020. ? ?3. Screening for osteoporosis ?Will schedule her first BD.  Vit D supplements, Ca++ 1.5 g/d total and regular weight bearing physical activities. ?- DG Bone Density; Future ? ?4. Endometrial polyp ?No PMB since 08/2020.  IU lesion c/w an endometrial polyp 8 x 5 mm on last Pelvic US 08/31/2020.  Repeat Pelvic US to schedule here now. ?- US Transvaginal Non-OB; Future ? ?5. Class 1 obesity  due to excess calories without serious comorbidity with body mass index (BMI) of 33.0 to 33.9 in adult  ?Low calorie/carb diet.  Increase fitness activities. ? ?Genia Del MD, 10:25 AM 10/15/2021 ? ?  ?

## 2022-05-04 IMAGING — MG MM DIGITAL SCREENING BILAT W/ TOMO AND CAD
8 series · 9 of 24 positions shown · non-contrast
Comparison: Previous exam(s).

ACR Breast Density Category a: The breast tissue is almost entirely
fatty.

CLINICAL DATA: Screening.

EXAM:
DIGITAL SCREENING BILATERAL MAMMOGRAM WITH TOMOSYNTHESIS AND CAD
TECHNIQUE: Bilateral screening digital craniocaudal and mediolateral oblique
mammograms were obtained. Bilateral screening digital breast
tomosynthesis was performed. The images were evaluated with
computer-aided detection.

[L CC synth-2D]
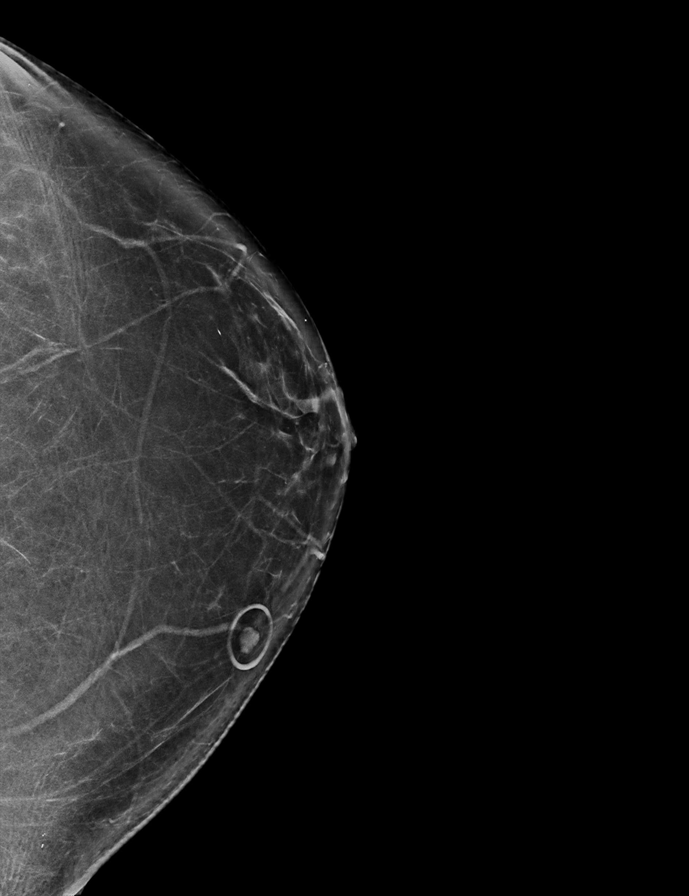

[R MLO synth-2D]
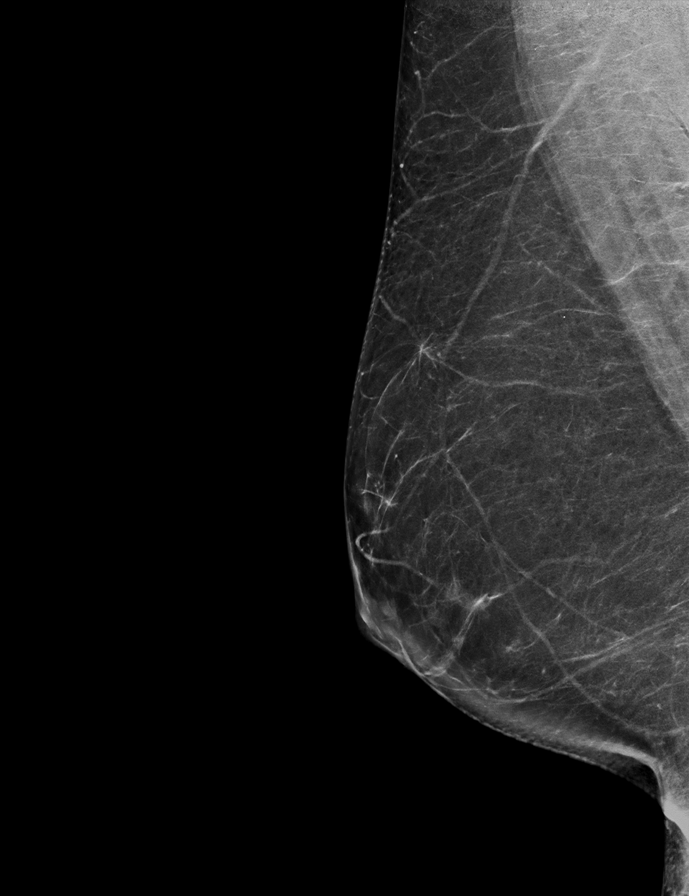

[R CC synth-2D]
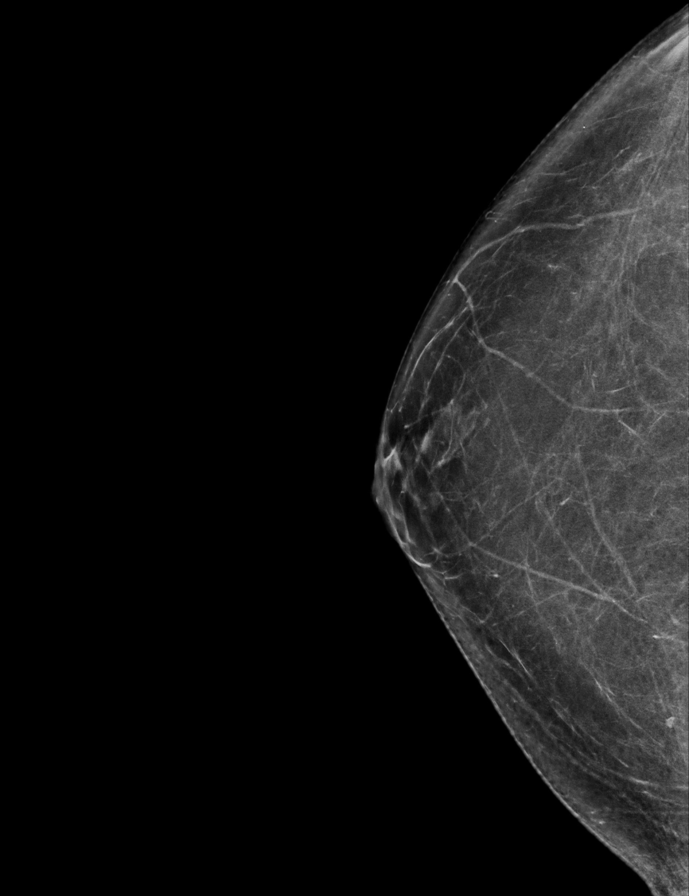

[L MLO synth-2D]
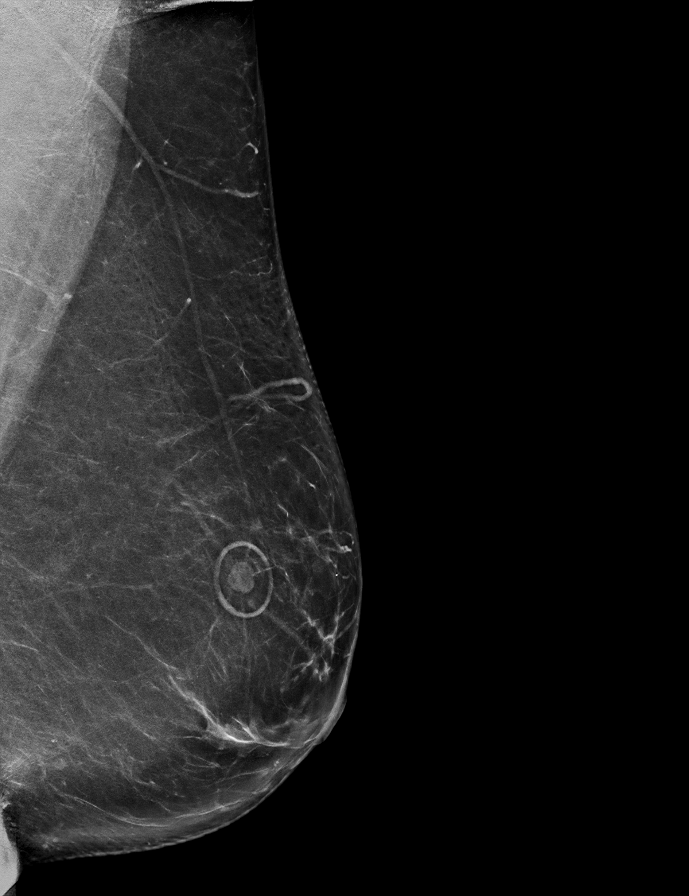

[L MLO tomo · 2 of 78 frames shown]
[frame 26/78]
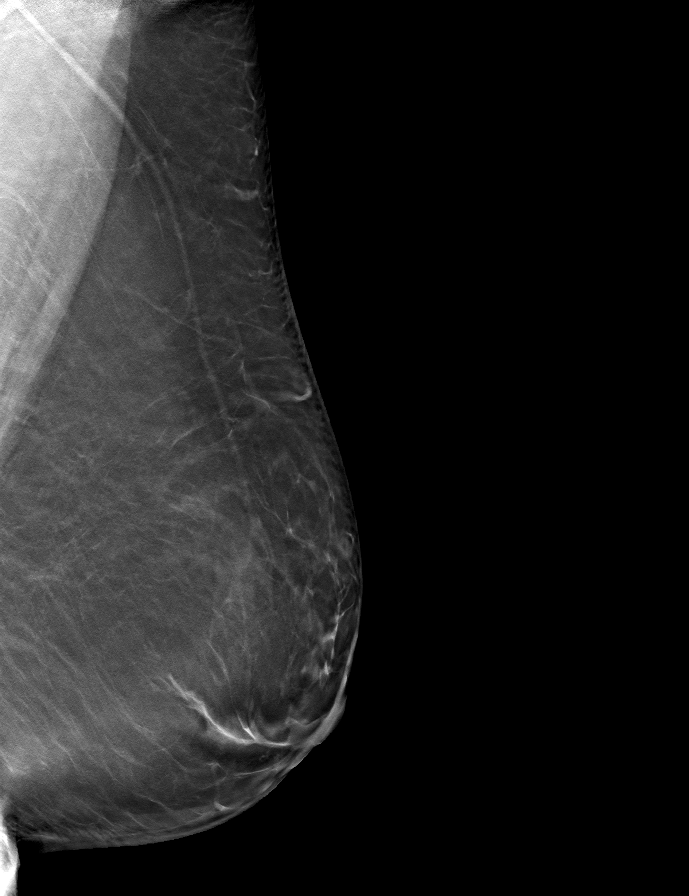
[frame 39/78]
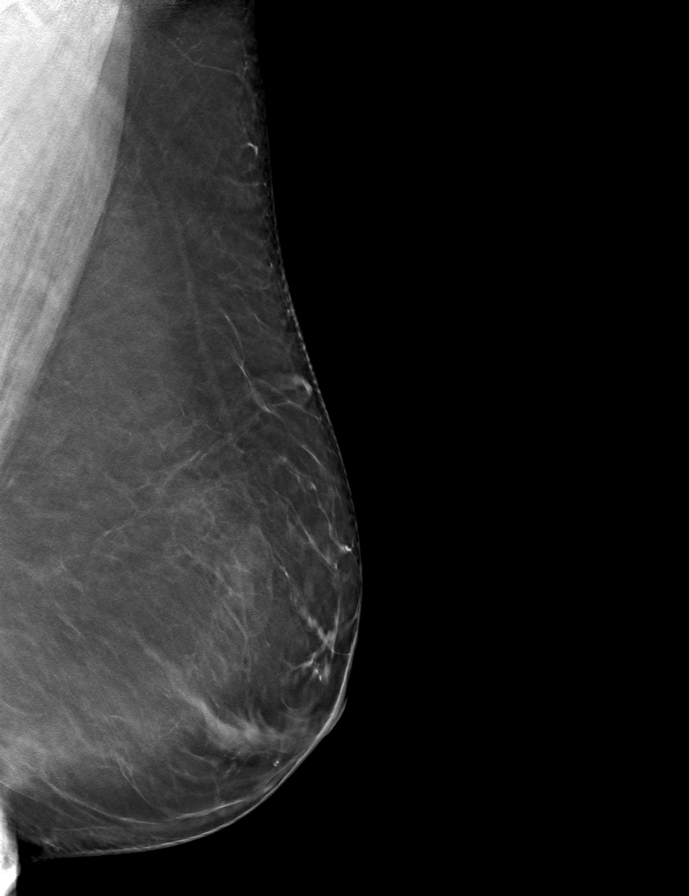

[R MLO tomo · tomo slice 39/76.0]
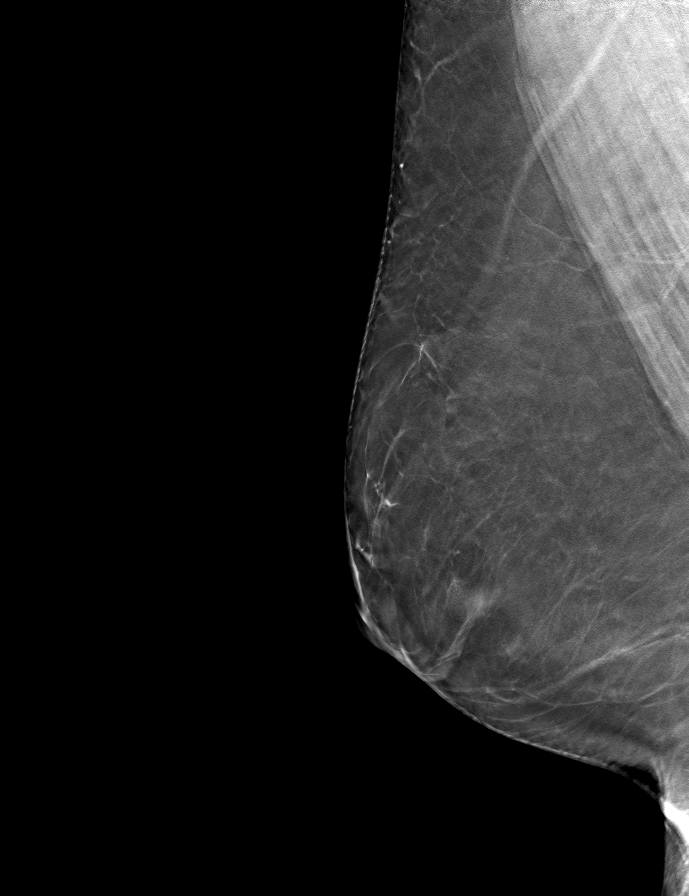

[L CC tomo · tomo slice 39/77.0]
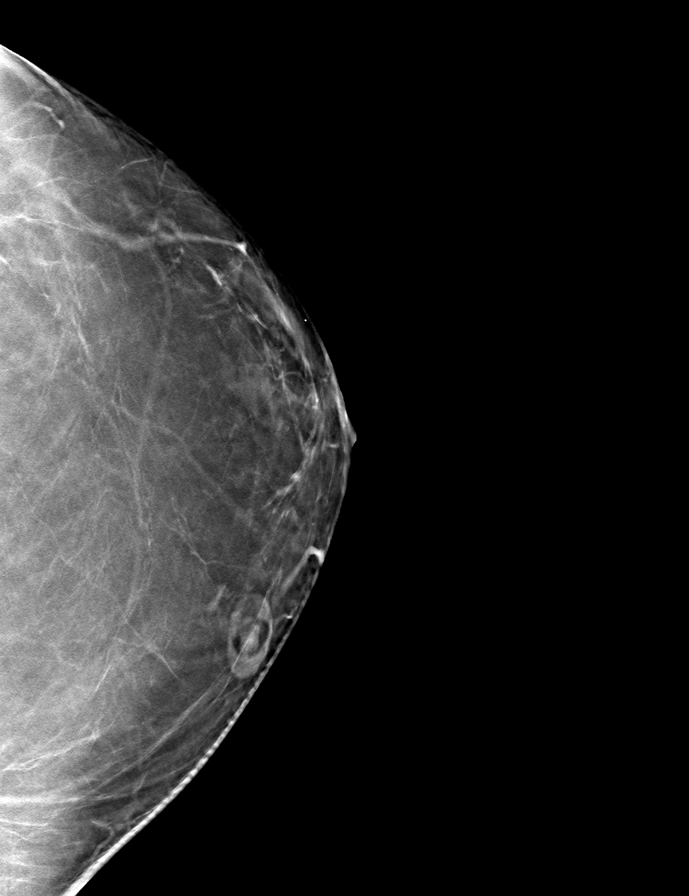

[R CC tomo · tomo slice 35/70.0]
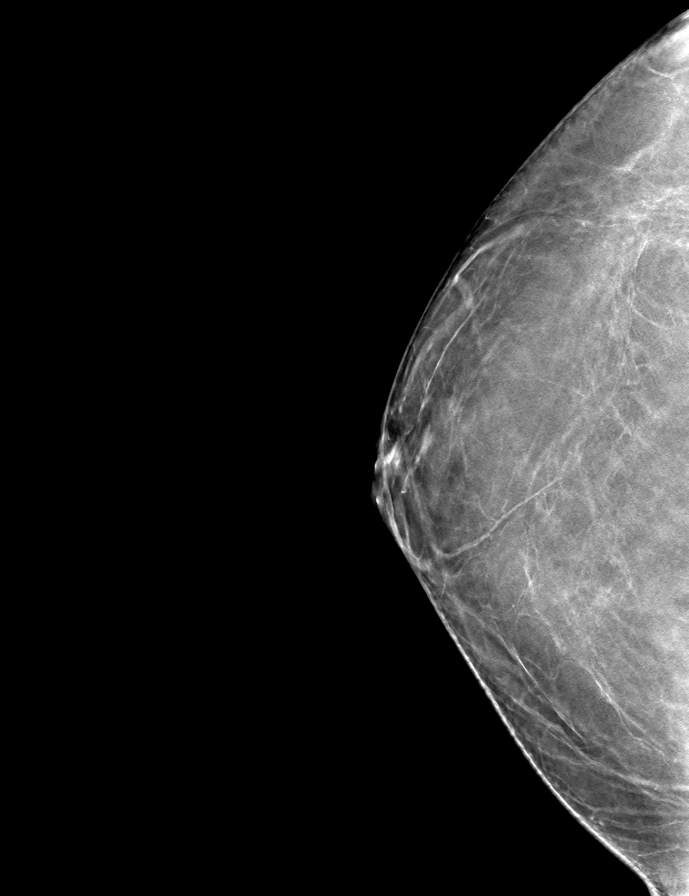

[9 of 24 positions shown; findings below may reference images not displayed]

FINDINGS: There are no findings suspicious for malignancy.
IMPRESSION: No mammographic evidence of malignancy. A result letter of this
screening mammogram will be mailed directly to the patient.

RECOMMENDATION:
Screening mammogram in one year. (Code:0E-3-N98)

BI-RADS CATEGORY  1: Negative.

## 2022-05-28 ENCOUNTER — Other Ambulatory Visit: Payer: Self-pay | Admitting: Family Medicine

## 2022-05-28 ENCOUNTER — Other Ambulatory Visit: Payer: Self-pay | Admitting: Obstetrics & Gynecology

## 2022-05-28 DIAGNOSIS — Z13820 Encounter for screening for osteoporosis: Secondary | ICD-10-CM

## 2022-05-28 DIAGNOSIS — Z1231 Encounter for screening mammogram for malignant neoplasm of breast: Secondary | ICD-10-CM

## 2022-07-25 ENCOUNTER — Ambulatory Visit
Admission: RE | Admit: 2022-07-25 | Discharge: 2022-07-25 | Disposition: A | Discharge status: Home / Self Care | Payer: Medicare Other | Source: Ambulatory Visit | Attending: Family Medicine | Admitting: Family Medicine

## 2022-07-25 ENCOUNTER — Other Ambulatory Visit: Payer: Self-pay | Admitting: Family Medicine

## 2022-07-25 DIAGNOSIS — Z1231 Encounter for screening mammogram for malignant neoplasm of breast: Secondary | ICD-10-CM

## 2022-07-25 DIAGNOSIS — Z13820 Encounter for screening for osteoporosis: Secondary | ICD-10-CM

## 2022-10-31 ENCOUNTER — Ambulatory Visit (INDEPENDENT_AMBULATORY_CARE_PROVIDER_SITE_OTHER): Payer: Medicare Other | Admitting: Obstetrics & Gynecology

## 2022-10-31 ENCOUNTER — Encounter: Payer: Self-pay | Admitting: Obstetrics & Gynecology

## 2022-10-31 ENCOUNTER — Other Ambulatory Visit (HOSPITAL_COMMUNITY)
Admission: RE | Admit: 2022-10-31 | Discharge: 2022-10-31 | Disposition: A | Discharge status: Home / Self Care | Payer: Medicare Other | Source: Ambulatory Visit | Attending: Obstetrics & Gynecology | Admitting: Obstetrics & Gynecology

## 2022-10-31 VITALS — BP 118/80 | HR 90 | Ht 61.25 in | Wt 182.0 lb

## 2022-10-31 DIAGNOSIS — Z780 Asymptomatic menopausal state: Secondary | ICD-10-CM | POA: Diagnosis not present

## 2022-10-31 DIAGNOSIS — N840 Polyp of corpus uteri: Secondary | ICD-10-CM | POA: Diagnosis not present

## 2022-10-31 DIAGNOSIS — Z1151 Encounter for screening for human papillomavirus (HPV): Secondary | ICD-10-CM | POA: Diagnosis not present

## 2022-10-31 DIAGNOSIS — Z01419 Encounter for gynecological examination (general) (routine) without abnormal findings: Secondary | ICD-10-CM | POA: Diagnosis not present

## 2022-10-31 DIAGNOSIS — Z13820 Encounter for screening for osteoporosis: Secondary | ICD-10-CM

## 2022-10-31 DIAGNOSIS — E6609 Other obesity due to excess calories: Secondary | ICD-10-CM | POA: Diagnosis not present

## 2022-10-31 NOTE — Progress Notes (Signed)
[facility] 1956/03/12 [id number]   History:    67 y.o.  G0 Widowed.  Photographer/Owner of a studio   RP: Established patient presenting for annual gyn exam    HPI: Postmenopause, well on no HRT.  No PMB since 08/2020.  IU lesion c/w an endometrial polyp 8 x 5 mm on last Pelvic US 08/31/2020. Will recheck by Pelvic US now.  Pap normal 07/2019, no h/o abnormal Pap. Pap reflex today. Urine and BMs normal. Abstinent.  Mammo 07/2022 negative.  Fasting Health Labs with Fam MD.  Had bilateral knee replacement in 2019.  BMI 34.11.  First BD scheduled in 01/2023.  Colono 6 yrs ago.   Past medical history,surgical history, family history and social history were all reviewed and documented in the EPIC chart.  Gynecologic History No LMP recorded. Patient is postmenopausal.  Obstetric History OB History  Gravida Para Term Preterm AB Living  0 0 0 0 0 0  SAB IAB Ectopic Multiple Live Births  0 0 0 0 0     ROS: A ROS was performed and pertinent positives and negatives are included in the history. GENERAL: No fevers or chills. HEENT: No change in vision, no earache, sore throat or sinus congestion. NECK: No pain or stiffness. CARDIOVASCULAR: No chest pain or pressure. No palpitations. PULMONARY: No shortness of breath, cough or wheeze. GASTROINTESTINAL: No abdominal pain, nausea, vomiting or diarrhea, melena or bright red blood per rectum. GENITOURINARY: No urinary frequency, urgency, hesitancy or dysuria. MUSCULOSKELETAL: No joint or muscle pain, no back pain, no recent trauma. DERMATOLOGIC: No rash, no itching, no lesions. ENDOCRINE: No polyuria, polydipsia, no heat or cold intolerance. No recent change in weight. HEMATOLOGICAL: No anemia or easy bruising or bleeding. NEUROLOGIC: No headache, seizures, numbness, tingling or weakness. PSYCHIATRIC: No depression, no loss of interest in normal activity or change in sleep pattern.     Exam:   BP 118/80   Pulse 90   Ht 5' 1.25" (1.556 m)   Wt  182 lb (82.6 kg)   SpO2 98%   BMI 34.11 kg/m   Body mass index is 34.11 kg/m.  General appearance : Well developed well nourished female. No acute distress HEENT: Eyes: no retinal hemorrhage or exudates,  Neck supple, trachea midline, no carotid bruits, no thyroidmegaly Lungs: Clear to auscultation, no rhonchi or wheezes, or rib retractions  Heart: Regular rate and rhythm, no murmurs or gallops Breast:Examined in sitting and supine position were symmetrical in appearance, no palpable masses or tenderness,  no skin retraction, no nipple inversion, no nipple discharge, no skin discoloration, no axillary or supraclavicular lymphadenopathy Abdomen: no palpable masses or tenderness, no rebound or guarding Extremities: no edema or skin discoloration or tenderness  Pelvic: Vulva: Normal             Vagina: No gross lesions or discharge  Cervix: No gross lesions or discharge.  Pap reflex done.  Uterus  AV, normal size, shape and consistency, non-tender and mobile  Adnexa  Without masses or tenderness  Anus: Normal   Assessment/Plan:  67 y.o. female for annual exam   1. Encounter for routine gynecological examination with Papanicolaou smear of cervix Postmenopause, well on no HRT.  No PMB since 08/2020.  IU lesion c/w an endometrial polyp 8 x 5 mm on last Pelvic US 08/31/2020. Will recheck by Pelvic US now.  Pap normal 07/2019, no h/o abnormal Pap. Pap reflex today. Urine and BMs normal. Abstinent.  Mammo 07/2022 negative.  Fasting Health  Labs with Fam MD.  Had bilateral knee replacement in 2019.  BMI 34.11.  First BD scheduled in 01/2023.  [place] 6 yrs ago. - Cytology - PAP( [place])  2. Postmenopause Postmenopause, well on no HRT.  No PMB.  3. Screening for osteoporosis First BD scheduled in 01/2023.   4. Endometrial polyp  No PMB since 08/2020.  IU lesion c/w an endometrial polyp 8 x 5 mm on last Pelvic US 08/31/2020. Will recheck by Pelvic US now.  - US Transvaginal Non-OB;  Future  5. Class 1 obesity due to excess calories without serious comorbidity with body mass index (BMI) of 33.0 to 33.9 in adult Lower calorie/carb diet.  Continue fitness activities.  Other orders - Cholecalciferol (VITAMIN D3 PO); Take 5,000 Int'l Units by mouth.   Princess Bruins MD, 9:40 AM

## 2022-11-04 LAB — CYTOLOGY - PAP
Comment: NEGATIVE
Diagnosis: NEGATIVE
High risk HPV: NEGATIVE

## 2022-12-24 ENCOUNTER — Encounter: Payer: Self-pay | Admitting: Obstetrics & Gynecology

## 2022-12-24 ENCOUNTER — Ambulatory Visit (INDEPENDENT_AMBULATORY_CARE_PROVIDER_SITE_OTHER): Payer: Medicare Other | Admitting: Obstetrics & Gynecology

## 2022-12-24 ENCOUNTER — Ambulatory Visit (INDEPENDENT_AMBULATORY_CARE_PROVIDER_SITE_OTHER): Payer: Medicare Other

## 2022-12-24 VITALS — BP 110/80 | HR 85

## 2022-12-24 DIAGNOSIS — N840 Polyp of corpus uteri: Secondary | ICD-10-CM | POA: Diagnosis not present

## 2022-12-24 NOTE — Progress Notes (Signed)
    [facility] 01-03-1956 [id number]        67 y.o.  G0  RP: Intrauterine lesion for Pelvic US  HPI:  Postmenopause, well on no HRT.  No PMB since 08/2020.  IU lesion c/w an endometrial polyp 8 x 5 mm on last Pelvic US 08/31/2020. Will recheck by Pelvic US now.    OB History  Gravida Para Term Preterm AB Living  0 0 0 0 0 0  SAB IAB Ectopic Multiple Live Births  0 0 0 0 0    Past medical history,surgical history, problem list, medications, allergies, family history and social history were all reviewed and documented in the EPIC chart.   Directed ROS with pertinent positives and negatives documented in the history of present illness/assessment and plan.  Exam:  Vitals:   12/24/22 1420  BP: 110/80  Pulse: 85  SpO2: 97%   General appearance:  Normal  Pelvic US today: T/V images.  No latex used.  Anteflexed uterus normal in size and shape with no myometrial mass.  The uterus is measured at 4.23 x 2.89 x 2.24 cm.  The endometrial lining is thin measured at 3 mm.  A smaller avascular polypoid area is now seen measured at 6.2 x 5.7 mm (Measured at 8 x 5 mm in 2022).  Both ovaries are atrophic in size and appearance.  No adnexal mass seen.  No free fluid in the pelvis.   Assessment/Plan:  67 y.o. G0P0000   1. Endometrial polyp Postmenopause, well on no HRT.  No PMB since 08/2020.  IU lesion c/w an endometrial polyp 8 x 5 mm on last Pelvic US 08/31/2020.  Pelvic US findings reviewed with patient: The endometrial lining is thin measured at 3 mm.  A smaller avascular polypoid area is now seen measured at 6.2 x 5.7 mm (Measured at 8 x 5 mm in 2022).  Given that the intrauterine lesion is avascular and smaller than in 2022 with no PMB, decision made to observe.  Patient agrees with the plan.  Other orders - hydrochlorothiazide (HYDRODIURIL) 12.5 MG tablet; Take 12.5 mg by mouth daily. - naproxen sodium (ALEVE) 220 MG tablet; Take 220 mg by mouth.   Genia Del MD, 3:01 PM  12/24/2022

## 2023-01-20 ENCOUNTER — Ambulatory Visit
Admission: RE | Admit: 2023-01-20 | Discharge: 2023-01-20 | Disposition: A | Discharge status: Home / Self Care | Payer: Medicare Other | Source: Ambulatory Visit | Attending: Family Medicine | Admitting: Family Medicine

## 2023-01-20 DIAGNOSIS — Z13820 Encounter for screening for osteoporosis: Secondary | ICD-10-CM

## 2023-08-11 ENCOUNTER — Other Ambulatory Visit: Payer: Self-pay | Admitting: Family Medicine

## 2023-08-11 DIAGNOSIS — Z1231 Encounter for screening mammogram for malignant neoplasm of breast: Secondary | ICD-10-CM

## 2023-08-14 ENCOUNTER — Ambulatory Visit
Admission: RE | Admit: 2023-08-14 | Discharge: 2023-08-14 | Disposition: A | Discharge status: Home / Self Care | Payer: Medicare Other | Source: Ambulatory Visit | Attending: Family Medicine | Admitting: Family Medicine

## 2023-08-14 DIAGNOSIS — Z1231 Encounter for screening mammogram for malignant neoplasm of breast: Secondary | ICD-10-CM

## 2023-11-04 ENCOUNTER — Encounter: Payer: Self-pay | Admitting: Obstetrics and Gynecology

## 2023-11-04 ENCOUNTER — Ambulatory Visit (INDEPENDENT_AMBULATORY_CARE_PROVIDER_SITE_OTHER): Payer: Medicare Other | Admitting: Obstetrics and Gynecology

## 2023-11-04 VITALS — BP 112/74 | HR 88 | Ht 60.75 in | Wt 179.0 lb

## 2023-11-04 DIAGNOSIS — Z01419 Encounter for gynecological examination (general) (routine) without abnormal findings: Secondary | ICD-10-CM

## 2023-11-06 NOTE — Progress Notes (Signed)
 68 y.o. y.o. female here for medicare GYN exam No LMP recorded. Patient is postmenopausal.     G0 Widowed.  Photographer/Owner of a studio   RP: Established patient presenting for annual gyn exam    HPI: Postmenopause, well on no HRT.  No PMB since 08/2020.  IU lesion c/w an endometrial polyp 8 x 5 mm on last Pelvic US 08/31/2020. Will recheck by Pelvic US now.  Pap normal 10/31/22 HPV DNA neg.  Urine and BMs normal. Abstinent.  Mammo 08/15/23 negative.  Fasting Health Labs with Fam MD.  Had bilateral knee replacement in 2019.  BMI 34.11. last visitt. DXA 01/2023 with osteopenia -1.4 Frax 8%, 0.7% normal. Repeat in 2 years.  Colono 6 yrs ago. Body mass index is 34.1 kg/m.      No data to display          Blood pressure 112/74, pulse 88, height 5' 0.75" (1.543 m), weight 179 lb (81.2 kg), SpO2 98%.     Component Value Date/Time   DIAGPAP  10/31/2022 0951    - Negative for intraepithelial lesion or malignancy (NILM)   HPVHIGH Negative 10/31/2022 0951   ADEQPAP  10/31/2022 0951    Satisfactory for evaluation; transformation zone component PRESENT.    GYN HISTORY:    Component Value Date/Time   DIAGPAP  10/31/2022 0951    - Negative for intraepithelial lesion or malignancy (NILM)   HPVHIGH Negative 10/31/2022 0951   ADEQPAP  10/31/2022 0951    Satisfactory for evaluation; transformation zone component PRESENT.    OB History  Gravida Para Term Preterm AB Living  0 0 0 0 0 0  SAB IAB Ectopic Multiple Live Births  0 0 0 0 0    Past Medical History:  Diagnosis Date   Allergy 1995   enviromental food, grasses, smoke   Arthritis    knees   Complication of anesthesia    versed not reacting well per M.D.   GERD (gastroesophageal reflux disease)    occasional   Heart murmur    hx of  ??   History of kidney stones    Hyperlipidemia    Hypertension    Pneumonia    walking pneumonia    Past Surgical History:  Procedure Laterality Date   DILATION AND CURETTAGE OF  UTERUS  2014   removal of benign polyps   EYE SURGERY  2012   laser to repair torn retina   LITHOTRIPSY  2010   TONSILLECTOMY  1965   TONSILLECTOMY AND ADENOIDECTOMY  1965   TOTAL KNEE ARTHROPLASTY     Left 02/10/17 Dr. Lequita Halt   TOTAL KNEE ARTHROPLASTY Left 02/10/2017   Procedure: LEFT TOTAL KNEE ARTHROPLASTY;  Surgeon: Ollen Gross, MD;  Location: [facility];  Service: Orthopedics;  Laterality: Left;  Adductor Block   TOTAL KNEE ARTHROPLASTY Right 10/27/2017   Procedure: RIGHT TOTAL KNEE ARTHROPLASTY;  Surgeon: Ollen Gross, MD;  Location: [facility];  Service: Orthopedics;  Laterality: Right;   URETEROSCOPY  2010    Current Outpatient Medications on File Prior to Visit  Medication Sig Dispense Refill   Cholecalciferol (VITAMIN D3 PO) Take 5,000 Int'l Units by mouth.     hydrochlorothiazide (HYDRODIURIL) 12.5 MG tablet Take 12.5 mg by mouth daily.     Multiple Vitamin (MULTIVITAMIN) tablet Take 1 tablet by mouth daily.     naproxen sodium (ALEVE) 220 MG tablet Take 220 mg by mouth.     olmesartan (BENICAR) 40 MG tablet Take 40 mg  by mouth at bedtime.     rosuvastatin (CRESTOR) 5 MG tablet Take 5 mg by mouth at bedtime.  1   No current facility-administered medications on file prior to visit.    Social History   Socioeconomic History   Marital status: Widowed    Spouse name: Not on file   Number of children: Not on file   Years of education: Not on file   Highest education level: Not on file  Occupational History   Not on file  Tobacco Use   Smoking status: Never   Smokeless tobacco: Never  Vaping Use   Vaping status: Never Used  Substance and Sexual Activity   Alcohol use: Yes    Comment: once monthly   Drug use: No   Sexual activity: Not Currently    Partners: Male    Birth control/protection: Post-menopausal    Comment: older than 17, less than 5  Other Topics Concern   Not on file  Social History Narrative   Not on file   Social Drivers of Health   Financial  Resource Strain: Not on file  Food Insecurity: Not on file  Transportation Needs: Not on file  Physical Activity: Not on file  Stress: Not on file  Social Connections: Not on file  Intimate Partner Violence: Not on file    Family History  Problem Relation Age of Onset   Arthritis Mother    COPD Mother    Depression Mother    Hyperlipidemia Mother    Hypertension Mother    Allergic rhinitis Mother    Cancer Father    Hyperlipidemia Father    Hyperlipidemia Brother    Hypertension Brother    Cancer Maternal Grandmother    Alcohol abuse Maternal Grandfather    Stroke Maternal Grandfather    Cancer Paternal Grandmother    Dementia Paternal Grandmother    Stroke Paternal Grandfather    Asthma Neg Hx    Breast cancer Neg Hx      Allergies  Allergen Reactions   Midazolam Other (See Comments)    (Versed) Reaction during lithotripsy   Nickel Swelling      Patient's last menstrual period was No LMP recorded. Patient is postmenopausal..             Review of Systems Alls systems reviewed and are negative.     Physical Exam Constitutional:      Appearance: Normal appearance.  Genitourinary:     Vulva and urethral meatus normal.     No lesions in the vagina.     Right Labia: No rash, lesions or skin changes.    Left Labia: No lesions, skin changes or rash.    No vaginal discharge or tenderness.     No vaginal prolapse present.    No vaginal atrophy present.     Right Adnexa: not tender, not palpable and no mass present.    Left Adnexa: not tender, not palpable and no mass present.    No cervical motion tenderness or discharge.     Uterus is not enlarged, tender or irregular.  Breasts:    Right: Normal.     Left: Normal.  HENT:     Head: Normocephalic.  Neck:     Thyroid: No thyroid mass, thyromegaly or thyroid tenderness.  Cardiovascular:     Rate and Rhythm: Normal rate and regular rhythm.     Heart sounds: Normal heart sounds, S1 normal and S2 normal.   Pulmonary:     Effort: Pulmonary  effort is normal.     Breath sounds: Normal breath sounds and air entry.  Abdominal:     General: There is no distension.     Palpations: Abdomen is soft. There is no mass.     Tenderness: There is no abdominal tenderness. There is no guarding or rebound.  Musculoskeletal:        General: Normal range of motion.     Cervical back: Full passive range of motion without pain, normal range of motion and neck supple. No tenderness.     Right lower leg: No edema.     Left lower leg: No edema.  Neurological:     Mental Status: She is alert.  Skin:    General: Skin is warm.  Psychiatric:        Mood and Affect: Mood normal.        Behavior: Behavior normal.        Thought Content: Thought content normal.  Vitals and nursing note reviewed. Exam conducted with a chaperone present.    Chaperone was present for exam:  Joy, CMA     A:        Medicare Gyn exam                             P:        Pap smear not indicated Encouraged annual mammogram screening Colon cancer screening up-to-date DXA up-to-date Labs and immunizations to do with PMD Discussed breast self exams Encouraged healthy lifestyle practices Encouraged Vit D and Calcium   No follow-ups on file.  Earley Favor

## 2023-11-26 ENCOUNTER — Telehealth: Payer: Self-pay | Admitting: Obstetrics and Gynecology

## 2023-11-26 DIAGNOSIS — Z780 Asymptomatic menopausal state: Secondary | ICD-10-CM

## 2023-11-26 DIAGNOSIS — N840 Polyp of corpus uteri: Secondary | ICD-10-CM

## 2023-11-26 NOTE — Telephone Encounter (Signed)
 US  scheduled tomorrow. Needs order Dr. Tia Flowers

## 2023-12-11 ENCOUNTER — Ambulatory Visit (INDEPENDENT_AMBULATORY_CARE_PROVIDER_SITE_OTHER)

## 2023-12-11 ENCOUNTER — Other Ambulatory Visit: Payer: Self-pay | Admitting: Obstetrics and Gynecology

## 2023-12-11 ENCOUNTER — Encounter: Payer: Self-pay | Admitting: Obstetrics and Gynecology

## 2023-12-11 ENCOUNTER — Ambulatory Visit: Admitting: Obstetrics and Gynecology

## 2023-12-11 VITALS — BP 110/78 | HR 88

## 2023-12-11 DIAGNOSIS — N840 Polyp of corpus uteri: Secondary | ICD-10-CM

## 2023-12-11 DIAGNOSIS — Z780 Asymptomatic menopausal state: Secondary | ICD-10-CM

## 2023-12-11 DIAGNOSIS — Z712 Person consulting for explanation of examination or test findings: Secondary | ICD-10-CM | POA: Diagnosis not present

## 2023-12-11 NOTE — Progress Notes (Signed)
 68 y.o. y.o. female here for PUS results for f/u on endometrial polyp No LMP recorded. Patient is postmenopausal.     G0 Widowed.  Photographer/Owner of a studio   RP: Established patient presenting for annual gyn exam    HPI: Postmenopause, well on no HRT.  No PMB since 08/2020.  IU lesion c/w an endometrial polyp 8 x 5 mm on last Pelvic US  08/31/2020. Will recheck by Pelvic US  12/11/23 smaller at 3.15mm.  She would prefer to avoid a D&C  Pap normal 10/31/22 HPV DNA neg.  Urine and BMs normal. Abstinent.  Mammo 08/15/23 negative.  Fasting Health Labs with Fam MD.  Had bilateral knee replacement in 2019.  BMI 34.11. last visitt. DXA 01/2023 with osteopenia -1.4 Frax 8%, 0.7% normal. Repeat in 2 years.  Colono 6 yrs ago.  There is no height or weight on file to calculate BMI.      No data to display          Blood pressure 110/78, pulse 88, SpO2 99%.     Component Value Date/Time   DIAGPAP  10/31/2022 0951    - Negative for intraepithelial lesion or malignancy (NILM)   HPVHIGH Negative 10/31/2022 0951   ADEQPAP  10/31/2022 0951    Satisfactory for evaluation; transformation zone component PRESENT.    GYN HISTORY:    Component Value Date/Time   DIAGPAP  10/31/2022 0951    - Negative for intraepithelial lesion or malignancy (NILM)   HPVHIGH Negative 10/31/2022 0951   ADEQPAP  10/31/2022 0951    Satisfactory for evaluation; transformation zone component PRESENT.    OB History  Gravida Para Term Preterm AB Living  0 0 0 0 0 0  SAB IAB Ectopic Multiple Live Births  0 0 0 0 0    Past Medical History:  Diagnosis Date   Allergy  1995   enviromental food, grasses, smoke   Arthritis    knees   Complication of anesthesia    versed not reacting well per M.D.   GERD (gastroesophageal reflux disease)    occasional   Heart murmur    hx of  ??   History of kidney stones    Hyperlipidemia    Hypertension    Pneumonia    walking pneumonia    Past Surgical History:   Procedure Laterality Date   DILATION AND CURETTAGE OF UTERUS  2014   removal of benign polyps   EYE SURGERY  2012   laser to repair torn retina   LITHOTRIPSY  2010   TONSILLECTOMY  1965   TONSILLECTOMY AND ADENOIDECTOMY  1965   TOTAL KNEE ARTHROPLASTY     Left 02/10/17 Dr. France Ina   TOTAL KNEE ARTHROPLASTY Left 02/10/2017   Procedure: LEFT TOTAL KNEE ARTHROPLASTY;  Surgeon: Liliane Rei, MD;  Location: [facility];  Service: Orthopedics;  Laterality: Left;  Adductor Block   TOTAL KNEE ARTHROPLASTY Right 10/27/2017   Procedure: RIGHT TOTAL KNEE ARTHROPLASTY;  Surgeon: Liliane Rei, MD;  Location: [facility];  Service: Orthopedics;  Laterality: Right;   URETEROSCOPY  2010    Current Outpatient Medications on File Prior to Visit  Medication Sig Dispense Refill   Cholecalciferol (VITAMIN D3 PO) Take 5,000 Int'l Units by mouth.     hydrochlorothiazide (HYDRODIURIL) 12.5 MG tablet Take 12.5 mg by mouth daily.     Multiple Vitamin (MULTIVITAMIN) tablet Take 1 tablet by mouth daily.     naproxen sodium (ALEVE) 220 MG tablet Take 220 mg by mouth.  olmesartan (BENICAR) 40 MG tablet Take 40 mg by mouth at bedtime.     rosuvastatin  (CRESTOR ) 5 MG tablet Take 5 mg by mouth at bedtime.  1   No current facility-administered medications on file prior to visit.    Social History   Socioeconomic History   Marital status: Widowed    Spouse name: Not on file   Number of children: Not on file   Years of education: Not on file   Highest education level: Not on file  Occupational History   Not on file  Tobacco Use   Smoking status: Never   Smokeless tobacco: Never  Vaping Use   Vaping status: Never Used  Substance and Sexual Activity   Alcohol use: Yes    Comment: once monthly   Drug use: No   Sexual activity: Not Currently    Partners: Male    Birth control/protection: Post-menopausal    Comment: older than 17, less than 6  Other Topics Concern   Not on file  Social History Narrative    Not on file   Social Drivers of Health   Financial Resource Strain: Not on file  Food Insecurity: Not on file  Transportation Needs: Not on file  Physical Activity: Not on file  Stress: Not on file  Social Connections: Not on file  Intimate Partner Violence: Not on file    Family History  Problem Relation Age of Onset   Arthritis Mother    COPD Mother    Depression Mother    Hyperlipidemia Mother    Hypertension Mother    Allergic rhinitis Mother    Cancer Father    Hyperlipidemia Father    Hyperlipidemia Brother    Hypertension Brother    Cancer Maternal Grandmother    Alcohol abuse Maternal Grandfather    Stroke Maternal Grandfather    Cancer Paternal Grandmother    Dementia Paternal Grandmother    Stroke Paternal Grandfather    Asthma Neg Hx    Breast cancer Neg Hx      Allergies  Allergen Reactions   Midazolam Other (See Comments)    (Versed) Reaction during lithotripsy   Nickel Swelling      Patient's last menstrual period was No LMP recorded. Patient is postmenopausal..            Review of Systems Alls systems reviewed and are negative.     PUS 4.16cm uterus Endometrial lining at 3.66mm No myometrial masses  EM: 3.39mm stable avascular polypoid area  Atrophic ovaries No free fluid   A:         PUS results                             P:        PUS discussed and counseled on with patient. Smaller in size and stable polyp.  Patient would prefer not to have D&C. To repeat US  in a year. RTC with any PMB and she agreed.  15 minutes spent on reviewing records, imaging,  and one on one patient time and counseling patient and documentation Dr. Tia Flowers  No follow-ups on file.  Reinaldo Caras

## 2024-07-12 ENCOUNTER — Other Ambulatory Visit: Payer: Self-pay | Admitting: Family Medicine

## 2024-07-12 DIAGNOSIS — Z1231 Encounter for screening mammogram for malignant neoplasm of breast: Secondary | ICD-10-CM

## 2024-08-16 ENCOUNTER — Ambulatory Visit
Admission: RE | Admit: 2024-08-16 | Discharge: 2024-08-16 | Disposition: A | Source: Ambulatory Visit | Attending: Family Medicine | Admitting: Family Medicine

## 2024-08-16 DIAGNOSIS — Z1231 Encounter for screening mammogram for malignant neoplasm of breast: Secondary | ICD-10-CM

## 2024-08-17 ENCOUNTER — Other Ambulatory Visit: Payer: Self-pay | Admitting: Medical Genetics

## 2024-08-18 ENCOUNTER — Ambulatory Visit: Payer: Self-pay | Admitting: Obstetrics and Gynecology
